# Patient Record
Sex: Female | Born: 1940 | Race: White | Hispanic: No | Marital: Married | Smoking: Never smoker
Health system: Southern US, Community
[De-identification: ages and names within clinical notes are randomized; demographics above are authoritative.]

## PROBLEM LIST (undated history)

## (undated) DIAGNOSIS — R319 Hematuria, unspecified: Secondary | ICD-10-CM

## (undated) DIAGNOSIS — E079 Disorder of thyroid, unspecified: Secondary | ICD-10-CM

## (undated) DIAGNOSIS — N200 Calculus of kidney: Secondary | ICD-10-CM

## (undated) DIAGNOSIS — T8859XA Other complications of anesthesia, initial encounter: Secondary | ICD-10-CM

## (undated) DIAGNOSIS — R519 Headache, unspecified: Secondary | ICD-10-CM

## (undated) DIAGNOSIS — M8580 Other specified disorders of bone density and structure, unspecified site: Secondary | ICD-10-CM

## (undated) DIAGNOSIS — M1990 Unspecified osteoarthritis, unspecified site: Secondary | ICD-10-CM

## (undated) DIAGNOSIS — D1801 Hemangioma of skin and subcutaneous tissue: Secondary | ICD-10-CM

## (undated) DIAGNOSIS — K759 Inflammatory liver disease, unspecified: Secondary | ICD-10-CM

## (undated) DIAGNOSIS — E785 Hyperlipidemia, unspecified: Secondary | ICD-10-CM

## (undated) DIAGNOSIS — K635 Polyp of colon: Secondary | ICD-10-CM

## (undated) DIAGNOSIS — T4145XA Adverse effect of unspecified anesthetic, initial encounter: Secondary | ICD-10-CM

## (undated) DIAGNOSIS — K219 Gastro-esophageal reflux disease without esophagitis: Secondary | ICD-10-CM

## (undated) HISTORY — DX: Inflammatory liver disease, unspecified: K75.9

## (undated) HISTORY — PX: TONSILLECTOMY: SUR1361

## (undated) HISTORY — DX: Polyp of colon: K63.5

## (undated) HISTORY — DX: Hematuria, unspecified: R31.9

## (undated) HISTORY — DX: Other specified disorders of bone density and structure, unspecified site: M85.80

## (undated) HISTORY — PX: ESOPHAGOGASTRODUODENOSCOPY ENDOSCOPY: SHX5814

## (undated) HISTORY — DX: Calculus of kidney: N20.0

## (undated) HISTORY — DX: Disorder of thyroid, unspecified: E07.9

## (undated) HISTORY — DX: Hemangioma of skin and subcutaneous tissue: D18.01

## (undated) HISTORY — DX: Hyperlipidemia, unspecified: E78.5

---

## 1974-12-08 DIAGNOSIS — K759 Inflammatory liver disease, unspecified: Secondary | ICD-10-CM

## 1974-12-08 HISTORY — DX: Inflammatory liver disease, unspecified: K75.9

## 1992-12-08 HISTORY — PX: THYROIDECTOMY, PARTIAL: SHX18

## 1994-12-08 HISTORY — PX: ABDOMINAL HYSTERECTOMY: SHX81

## 1998-05-30 ENCOUNTER — Other Ambulatory Visit: Admission: RE | Admit: 1998-05-30 | Discharge: 1998-05-30 | Payer: Self-pay | Admitting: *Deleted

## 1999-06-05 ENCOUNTER — Other Ambulatory Visit: Admission: RE | Admit: 1999-06-05 | Discharge: 1999-06-05 | Payer: Self-pay | Admitting: *Deleted

## 1999-12-12 ENCOUNTER — Encounter: Admission: RE | Admit: 1999-12-12 | Discharge: 1999-12-12 | Payer: Self-pay | Admitting: Endocrinology

## 1999-12-12 ENCOUNTER — Encounter: Payer: Self-pay | Admitting: Endocrinology

## 2000-06-04 ENCOUNTER — Other Ambulatory Visit: Admission: RE | Admit: 2000-06-04 | Discharge: 2000-06-04 | Payer: Self-pay | Admitting: *Deleted

## 2001-06-14 ENCOUNTER — Other Ambulatory Visit: Admission: RE | Admit: 2001-06-14 | Discharge: 2001-06-14 | Payer: Self-pay | Admitting: *Deleted

## 2002-06-20 ENCOUNTER — Other Ambulatory Visit: Admission: RE | Admit: 2002-06-20 | Discharge: 2002-06-20 | Payer: Self-pay | Admitting: *Deleted

## 2003-06-26 ENCOUNTER — Other Ambulatory Visit: Admission: RE | Admit: 2003-06-26 | Discharge: 2003-06-26 | Payer: Self-pay | Admitting: *Deleted

## 2003-09-26 ENCOUNTER — Encounter (INDEPENDENT_AMBULATORY_CARE_PROVIDER_SITE_OTHER): Payer: Self-pay

## 2003-09-26 ENCOUNTER — Ambulatory Visit (HOSPITAL_COMMUNITY): Admission: RE | Admit: 2003-09-26 | Discharge: 2003-09-26 | Payer: Self-pay | Admitting: Gastroenterology

## 2004-07-10 ENCOUNTER — Other Ambulatory Visit: Admission: RE | Admit: 2004-07-10 | Discharge: 2004-07-10 | Payer: Self-pay | Admitting: *Deleted

## 2005-08-14 ENCOUNTER — Other Ambulatory Visit: Admission: RE | Admit: 2005-08-14 | Discharge: 2005-08-14 | Payer: Self-pay | Admitting: *Deleted

## 2006-08-20 ENCOUNTER — Other Ambulatory Visit: Admission: RE | Admit: 2006-08-20 | Discharge: 2006-08-20 | Payer: Self-pay | Admitting: *Deleted

## 2006-08-28 ENCOUNTER — Ambulatory Visit (HOSPITAL_COMMUNITY): Admission: RE | Admit: 2006-08-28 | Discharge: 2006-08-28 | Payer: Self-pay | Admitting: Endocrinology

## 2006-09-18 ENCOUNTER — Encounter: Admission: RE | Admit: 2006-09-18 | Discharge: 2006-09-18 | Payer: Self-pay | Admitting: Gastroenterology

## 2007-07-27 ENCOUNTER — Other Ambulatory Visit: Admission: RE | Admit: 2007-07-27 | Discharge: 2007-07-27 | Payer: Self-pay | Admitting: *Deleted

## 2008-08-01 ENCOUNTER — Other Ambulatory Visit: Admission: RE | Admit: 2008-08-01 | Discharge: 2008-08-01 | Payer: Self-pay | Admitting: Obstetrics & Gynecology

## 2009-08-26 ENCOUNTER — Encounter: Admission: RE | Admit: 2009-08-26 | Discharge: 2009-08-26 | Payer: Self-pay | Admitting: Orthopedic Surgery

## 2010-12-28 ENCOUNTER — Encounter: Payer: Self-pay | Admitting: Gastroenterology

## 2011-04-25 NOTE — Op Note (Signed)
NAME:  Stephanie Davila, Stephanie Davila                        ACCOUNT NO.:  [id number]   MEDICAL RECORD NO.:  [id number]                   PATIENT TYPE:  AMB   LOCATION:  ENDO                                 FACILITY:  [facility]   PHYSICIAN:  Petra Kuba, M.D.                 DATE OF BIRTH:  1941-02-23   DATE OF PROCEDURE:  09/26/2003  DATE OF DISCHARGE:                                 OPERATIVE REPORT   PROCEDURE PERFORMED:  Colonoscopy with hot biopsy.   ENDOSCOPIST:  Petra Kuba, M.D.   INDICATIONS FOR PROCEDURE:  Patient due for colonic screening.  Consent was  signed after the risks, benefits, methods and options were thoroughly  discussed in the office.   MEDICINES USED:  Demerol 80 mg, Versed 8 mg.   DESCRIPTION OF PROCEDURE:  Rectal inspection was pertinent for external  hemorrhoids, small.  Digital exam was negative.  A video pediatric  adjustable colonoscope was inserted and easily advanced around the colon to  the cecum.  This did not require any abdominal pressure or any position  changes.  No obvious abnormality was seen on insertion.  The cecum was  identified by the appendiceal orifice and the ileocecal valve.  In fact, the  scope was inserted a short ways into the terminal ileum which was normal.  Photodocumentation was obtained.  The scope was slowly withdrawn.  The prep  was adequate.  There was minimal liquid stool that required washing and  suctioning.  On slow withdrawal through the colon the right side of the  colon was normal.  As we withdrew around the left side, there were a few  tiny probable hyperplastic appearing polyps which were hot biopsied, put  into safe container.  Once back in the rectum, anorectal pull-through and  retroflexion confirmed some hemorrhoids.  Scope was straightened and  readvanced a short ways up the left side of the colon, air was suctioned,  scope removed.  The patient tolerated the procedure well.  There was no  immediate obvious  complication.   ENDOSCOPIC DIAGNOSIS:  1. Internal and external hemorrhoids.  2. Few tiny left-sided probably hyperplastic-appearing polyps hot biopsied.  3. Otherwise within normal limits to the terminal ileum.    PLAN:  Await pathology to determine future colonic screening, probably  recheck in five years.  Happy to see back p.r.n.  Otherwise return care to  Dr. Evlyn Kanner for the customary health care maintenance to include yearly  rectals and guaiacs.                                                Petra Kuba, M.D.    MEM/MEDQ  D:  09/26/2003  T:  09/26/2003  Job:  [id number]   cc:   Jeannett Senior A. [place],  M.D.  [address]  [place]  [place] [address]  Fax: [phone]

## 2012-05-25 ENCOUNTER — Ambulatory Visit (INDEPENDENT_AMBULATORY_CARE_PROVIDER_SITE_OTHER): Payer: Medicare Other | Admitting: Cardiovascular Disease

## 2012-05-25 ENCOUNTER — Encounter: Payer: Self-pay | Admitting: Cardiovascular Disease

## 2012-05-25 VITALS — BP 137/82 | HR 64 | Ht 65.0 in | Wt 145.0 lb

## 2012-05-25 DIAGNOSIS — R0789 Other chest pain: Secondary | ICD-10-CM | POA: Insufficient documentation

## 2012-05-25 DIAGNOSIS — R079 Chest pain, unspecified: Secondary | ICD-10-CM

## 2012-05-25 NOTE — Progress Notes (Signed)
   History of Present Illness: 71 yo WF with history of HLD, thyroid disease here today for evaluation of chest pain. She has been intolerant of statins in the past. She tells me that she has recently lost her mother to dementia and has been under much stress. She has had much anxiety. In January 2013, she was stressed and felt her heart pounding. She felt that she was having a panic attack. There was no chest pain or SOB at that time. Over the last two months, she has been awakened several times with right shoulder pain/upper right chest pain with radiation into her right neck. This has happended three times. She has no exertional chest pains or SOB.    Primary Care Physician: Adrian Prince  Past Medical History  Diagnosis Date  . Thyroid disease   . Hyperlipidemia   . Osteopenia     dexa 2008  . Nephrolithiasis   . Colon polyps   . Hepatitis   . Hematuria     Past Surgical History  Procedure Date  . Thyroidectomy, partial   . Abdominal hysterectomy   . Tonsillectomy     Current Outpatient Prescriptions  Medication Sig Dispense Refill  . levothyroxine (SYNTHROID, LEVOTHROID) 112 MCG tablet Take 112 mcg by mouth daily.        Allergies  Allergen Reactions  . Penicillins   . Sulfa Antibiotics     History   Social History  . Marital Status: Married    Spouse Name: N/A    Number of Children: 1  . Years of Education: N/A   Occupational History  . Retired Programmer, systems    Social History Main Topics  . Smoking status: Never Smoker   . Smokeless tobacco: Not on file  . Alcohol Use: No  . Drug Use: No  . Sexually Active: Not on file   Other Topics Concern  . Not on file   Social History Narrative  . No narrative on file    Family History  Problem Relation Age of Onset  . Hip fracture Mother   . Dementia Mother   . Coronary artery disease Father 44    CABG  . Coronary artery disease Paternal Aunt     Review of Systems:  As stated in the HPI and otherwise  negative.   BP 137/82  Pulse 64  Ht 5\' 5"  (1.651 m)  Wt 145 lb (65.772 kg)  BMI 24.13 kg/m2  Physical Examination: General: Well developed, well nourished, NAD HEENT: OP clear, mucus membranes moist SKIN: warm, dry. No rashes. Neuro: No focal deficits Musculoskeletal: Muscle strength 5/5 all ext Psychiatric: Mood and affect normal Neck: No JVD, no carotid bruits, no thyromegaly, no lymphadenopathy. Lungs:Clear bilaterally, no wheezes, rhonci, crackles Cardiovascular: Regular rate and rhythm. No murmurs, gallops or rubs. Abdomen:Soft. Bowel sounds present. Non-tender.  Extremities: No lower extremity edema. Pulses are 2 + in the bilateral DP/PT.  EKG: NSR, rate 64 bpm.

## 2012-05-25 NOTE — Patient Instructions (Signed)
Your physician recommends that you schedule a follow-up appointment in: 4-5 weeks.   Your physician has requested that you have an echocardiogram. Echocardiography is a painless test that uses sound waves to create images of your heart. It provides your doctor with information about the size and shape of your heart and how well your heart's chambers and valves are working. This procedure takes approximately one hour. There are no restrictions for this procedure.   Your physician has requested that you have an exercise tolerance test. For further information please visit [email or web address] Please also follow instruction sheet, as given.

## 2012-05-25 NOTE — Assessment & Plan Note (Signed)
Atypical chest pains with right shoulder pain. Her risk factors for CAD include age, family history and hyperlipidemia. I will arrange an echo to exclude structural heart disease and an exercise treadmill stress test to exclude ischemia.

## 2012-05-27 ENCOUNTER — Ambulatory Visit (HOSPITAL_COMMUNITY): Payer: Medicare Other | Attending: Cardiology | Admitting: Radiology

## 2012-05-27 DIAGNOSIS — R079 Chest pain, unspecified: Secondary | ICD-10-CM

## 2012-05-27 DIAGNOSIS — M79609 Pain in unspecified limb: Secondary | ICD-10-CM | POA: Insufficient documentation

## 2012-05-27 DIAGNOSIS — E785 Hyperlipidemia, unspecified: Secondary | ICD-10-CM | POA: Insufficient documentation

## 2012-05-27 DIAGNOSIS — R072 Precordial pain: Secondary | ICD-10-CM | POA: Insufficient documentation

## 2012-05-27 NOTE — Progress Notes (Signed)
Echocardiogram performed.  

## 2012-06-02 ENCOUNTER — Ambulatory Visit (INDEPENDENT_AMBULATORY_CARE_PROVIDER_SITE_OTHER): Payer: Medicare Other | Admitting: Nurse Practitioner

## 2012-06-02 ENCOUNTER — Encounter: Payer: Self-pay | Admitting: Nurse Practitioner

## 2012-06-02 DIAGNOSIS — R079 Chest pain, unspecified: Secondary | ICD-10-CM

## 2012-06-02 NOTE — Procedures (Signed)
Exercise Treadmill Test  Pre-Exercise Testing Evaluation Rhythm: normal sinus  Rate: 67   PR:  .15 QRS:  .07  QT:  .40 QTc: .42     Test  Exercise Tolerance Test Ordering MD: Melene Muller, MD  Interpreting MD: Loletta Specter , NP  Unique Test No: 1  Treadmill:  1  Indication for ETT: chest pain - rule out ischemia  Contraindication to ETT: No   Stress Modality: exercise - treadmill  Cardiac Imaging Performed: non   Protocol: standard Bruce - maximal  Max BP:  178/74  Max MPHR (bpm):  150 85% MPR (bpm):  128  MPHR obtained (bpm):  150 % MPHR obtained:  100%  Reached 85% MPHR (min:sec):  4:00 Total Exercise Time (min-sec):  9:06  Workload in METS:  10.2 Borg Scale: 13  Reason ETT Terminated:  patient's desire to stop    ST Segment Analysis At Rest: normal ST segments - no evidence of significant ST depression With Exercise: no evidence of significant ST depression  Other Information Arrhythmia:  occasional pvc Angina during ETT:  absent (0) Quality of ETT:  diagnostic  ETT Interpretation:  normal - no evidence of ischemia by ST analysis  Comments: Excellent exercise tolerance.  No chest pain or ecg changes.  Recommendations: Follow-up with Dr. Clifton James prn.

## 2012-06-04 ENCOUNTER — Encounter: Payer: BC Managed Care – PPO | Admitting: Physician Assistant

## 2012-06-30 ENCOUNTER — Ambulatory Visit: Payer: BC Managed Care – PPO | Admitting: Cardiovascular Disease

## 2012-10-19 ENCOUNTER — Other Ambulatory Visit: Payer: Self-pay | Admitting: Gastroenterology

## 2013-09-13 DIAGNOSIS — C44310 Basal cell carcinoma of skin of unspecified parts of face: Secondary | ICD-10-CM | POA: Insufficient documentation

## 2013-09-15 HISTORY — PX: MOHS SURGERY: SUR867

## 2013-09-19 ENCOUNTER — Encounter: Payer: Self-pay | Admitting: Nurse Practitioner

## 2013-09-20 ENCOUNTER — Encounter: Payer: Self-pay | Admitting: Nurse Practitioner

## 2013-09-20 ENCOUNTER — Ambulatory Visit (INDEPENDENT_AMBULATORY_CARE_PROVIDER_SITE_OTHER): Payer: Medicare Other | Admitting: Nurse Practitioner

## 2013-09-20 VITALS — BP 138/86 | HR 64 | Resp 16 | Ht 65.0 in | Wt 148.0 lb

## 2013-09-20 DIAGNOSIS — E039 Hypothyroidism, unspecified: Secondary | ICD-10-CM

## 2013-09-20 DIAGNOSIS — C44301 Unspecified malignant neoplasm of skin of nose: Secondary | ICD-10-CM

## 2013-09-20 DIAGNOSIS — Z01419 Encounter for gynecological examination (general) (routine) without abnormal findings: Secondary | ICD-10-CM

## 2013-09-20 DIAGNOSIS — C44319 Basal cell carcinoma of skin of other parts of face: Secondary | ICD-10-CM

## 2013-09-20 NOTE — Patient Instructions (Signed)

## 2013-09-20 NOTE — Progress Notes (Signed)
72 y.o. G1. Married Caucasian Fe here for annual exam.  Tried vaginal Estrace cream several times since last here and had severe itching. Also got a bad UTI after use the next day.  She does better with extra virgin olive oil.  Recent Mohs' surgery on end of nose for basal cell cancer.  Mother passed away last Feb 26, 2023.   No LMP recorded.          Sexually active: no  The current method of family planning is status post hysterectomy.    Exercising: yes  Gym/ health club routine includes walking on track . Smoker:  no  Health Maintenance: Pap:  08/01/08 normal MMG:  10/21/12 normal Colonoscopy:  2010 1 polyp repeat in 5 years BMD:   3 yrs ago at PCP TDaP:  09/12/11 Labs: per Dr. Evlyn Kanner   reports that she has never smoked. She has never used smokeless tobacco. She reports that she does not drink alcohol or use illicit drugs.  Past Medical History  Diagnosis Date  . Thyroid disease   . Hyperlipidemia   . Osteopenia     dexa 2008  . Nephrolithiasis   . Colon polyps   . Hepatitis 1976  . Hematuria     negative uro eval X 2  . 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 Thyroidectomy, partial  1994  . Tonsillectomy    . Abdominal hysterectomy  1996    BSO secondary to AUB and fibroids  . Esophagogastroduodenoscopy endoscopy  summer 2013    Dr. Ewing Schlein  . Mohs surgery  09/15/13    nose    Current Outpatient Prescriptions  Medication Sig Dispense Refill  . Calcium Carbonate (CALCIUM 600 PO) Take by mouth.      . cholecalciferol (VITAMIN D) 1000 UNITS tablet Take 1,000 Units by mouth daily.      Marland Kitchen levothyroxine (SYNTHROID, LEVOTHROID) 112 MCG tablet Take 112 mcg by mouth daily.      . Multiple Vitamin (MULTIVITAMIN) tablet Take 1 tablet by mouth daily.      . temazepam (RESTORIL) 15 MG capsule as needed.       No current facility-administered medications for this visit.    Family History  Problem Relation Age of Onset   . Hip fracture Mother   . Dementia Mother   . Coronary artery disease Father 79    CABG  . Coronary artery disease Paternal Aunt     ROS:  Pertinent items are noted in HPI.  Otherwise, a comprehensive ROS was negative.  Exam:   BP 138/86  Pulse 64  Resp 16  Ht 5\' 5"  (1.651 m)  Wt 148 lb (67.132 kg)  BMI 24.63 kg/m2 Height: 5\' 5"  (165.1 cm)  Ht Readings from Last 3 Encounters:  09/20/13 5\' 5"  (1.651 m)  05/25/12 5\' 5"  (1.651 m)    General appearance: alert, cooperative and appears stated age Head: Normocephalic, without obvious abnormality, atraumatic Neck: no adenopathy, supple, symmetrical, trachea midline and thyroid normal to inspection and palpation Lungs: clear to auscultation bilaterally Breasts: normal appearance, no masses or tenderness Heart: regular rate and rhythm Abdomen: soft, non-tender; no masses,  no organomegaly Extremities: extremities normal, atraumatic, no cyanosis or edema Skin: Skin color, texture, turgor normal. No rashes or lesions band aid on the end of nose. Lymph nodes: Cervical, supraclavicular, and axillary nodes normal. No abnormal inguinal nodes palpated Neurologic: Grossly normal   Pelvic: External genitalia:  no lesions              Urethra:  normal appearing urethra with no masses, tenderness or lesions              Bartholin's and Skene's: normal                 Vagina: normal appearing vagina with normal color and discharge, no lesions              Cervix: absent              Pap taken: no Bimanual Exam:  Uterus:  uterus absent              Adnexa: no mass, fullness, tenderness               Rectovaginal: Confirms               Anus:  normal sphincter tone, no lesions  A:  Well Woman with normal exam  S/P TVH/ BSO secondary to AUB and fibroids - off ERT 2007  Atrophic vaginitis OTC treatment only  Recent Mohs' surgery for basal cell cancer on the nose  Hypothyroid    P:   Pap smear as per guidelines Not done today  Mammogram  due 11/14  Counseled on breast self exam, adequate intake of calcium and vitamin D, diet and exercise, Kegel's exercises return annually or prn  An After Visit Summary was printed and given to the patient.

## 2013-09-22 NOTE — Progress Notes (Signed)
Encounter reviewed by Dr. Brook Silva.  

## 2013-12-07 ENCOUNTER — Ambulatory Visit: Payer: Self-pay | Admitting: Otolaryngology

## 2014-02-15 DIAGNOSIS — R1310 Dysphagia, unspecified: Secondary | ICD-10-CM | POA: Insufficient documentation

## 2014-02-15 DIAGNOSIS — R1110 Vomiting, unspecified: Secondary | ICD-10-CM

## 2014-02-15 DIAGNOSIS — IMO0001 Reserved for inherently not codable concepts without codable children: Secondary | ICD-10-CM | POA: Insufficient documentation

## 2014-02-17 ENCOUNTER — Other Ambulatory Visit: Payer: Self-pay | Admitting: Gastroenterology

## 2014-02-17 DIAGNOSIS — R079 Chest pain, unspecified: Secondary | ICD-10-CM

## 2014-02-17 DIAGNOSIS — R634 Abnormal weight loss: Secondary | ICD-10-CM

## 2014-02-17 DIAGNOSIS — R1010 Upper abdominal pain, unspecified: Secondary | ICD-10-CM

## 2014-02-23 ENCOUNTER — Ambulatory Visit
Admission: RE | Admit: 2014-02-23 | Discharge: 2014-02-23 | Disposition: A | Discharge status: Home / Self Care | Payer: Medicare Other | Source: Ambulatory Visit | Attending: Gastroenterology | Admitting: Gastroenterology

## 2014-02-23 DIAGNOSIS — R634 Abnormal weight loss: Secondary | ICD-10-CM

## 2014-02-23 DIAGNOSIS — R1010 Upper abdominal pain, unspecified: Secondary | ICD-10-CM

## 2014-02-23 DIAGNOSIS — R079 Chest pain, unspecified: Secondary | ICD-10-CM

## 2014-02-23 MED ORDER — IOHEXOL 300 MG/ML  SOLN
100.0000 mL | Freq: Once | INTRAMUSCULAR | Status: AC | PRN
  Administered 2014-02-23: 100 mL via INTRAVENOUS

## 2014-03-27 ENCOUNTER — Ambulatory Visit (HOSPITAL_COMMUNITY)
Admission: RE | Admit: 2014-03-27 | Discharge: 2014-03-27 | Disposition: A | Discharge status: Home / Self Care | Payer: Medicare Other | Source: Ambulatory Visit | Attending: Gastroenterology | Admitting: Gastroenterology

## 2014-03-27 ENCOUNTER — Encounter (HOSPITAL_COMMUNITY)
Admission: RE | Disposition: A | Discharge status: Home / Self Care | Payer: Self-pay | Source: Ambulatory Visit | Attending: Gastroenterology

## 2014-03-27 DIAGNOSIS — R1310 Dysphagia, unspecified: Secondary | ICD-10-CM | POA: Insufficient documentation

## 2014-03-27 DIAGNOSIS — K220 Achalasia of cardia: Secondary | ICD-10-CM | POA: Insufficient documentation

## 2014-03-27 DIAGNOSIS — R1110 Vomiting, unspecified: Secondary | ICD-10-CM | POA: Insufficient documentation

## 2014-03-27 HISTORY — PX: ESOPHAGEAL MANOMETRY: SHX5429

## 2014-03-27 SURGERY — MANOMETRY, ESOPHAGUS

## 2014-03-27 MED ORDER — LIDOCAINE VISCOUS 2 % MT SOLN
OROMUCOSAL | Status: AC
  Filled 2014-03-27: qty 15

## 2014-03-27 SURGICAL SUPPLY — 1 items: FACESHIELD LNG OPTICON STERILE (SAFETY) IMPLANT

## 2014-03-28 ENCOUNTER — Encounter (HOSPITAL_COMMUNITY): Payer: Self-pay | Admitting: Gastroenterology

## 2014-03-29 ENCOUNTER — Other Ambulatory Visit: Payer: Self-pay | Admitting: Gastroenterology

## 2014-03-30 NOTE — Addendum Note (Signed)
Addended by: Kaimani Clayson on: 03/30/2014 08:15 AM   Modules accepted: Orders  

## 2014-03-31 ENCOUNTER — Encounter (HOSPITAL_COMMUNITY): Payer: Self-pay | Admitting: *Deleted

## 2014-03-31 ENCOUNTER — Ambulatory Visit (HOSPITAL_COMMUNITY)
Admission: RE | Admit: 2014-03-31 | Discharge: 2014-03-31 | Disposition: A | Discharge status: Home / Self Care | Payer: Medicare Other | Source: Ambulatory Visit | Attending: Gastroenterology | Admitting: Gastroenterology

## 2014-03-31 ENCOUNTER — Encounter (HOSPITAL_COMMUNITY)
Admission: RE | Disposition: A | Discharge status: Home / Self Care | Payer: Self-pay | Source: Ambulatory Visit | Attending: Gastroenterology

## 2014-03-31 DIAGNOSIS — K220 Achalasia of cardia: Secondary | ICD-10-CM | POA: Insufficient documentation

## 2014-03-31 HISTORY — PX: ESOPHAGOGASTRODUODENOSCOPY: SHX5428

## 2014-03-31 HISTORY — DX: Other complications of anesthesia, initial encounter: T88.59XA

## 2014-03-31 HISTORY — DX: Adverse effect of unspecified anesthetic, initial encounter: T41.45XA

## 2014-03-31 HISTORY — PX: BOTOX INJECTION: SHX5754

## 2014-03-31 SURGERY — EGD (ESOPHAGOGASTRODUODENOSCOPY)
Anesthesia: Moderate Sedation

## 2014-03-31 MED ORDER — FENTANYL CITRATE 0.05 MG/ML IJ SOLN
INTRAMUSCULAR | Status: DC | PRN
  Administered 2014-03-31 (×2): 25 ug via INTRAVENOUS
  Administered 2014-03-31: 12.5 ug via INTRAVENOUS

## 2014-03-31 MED ORDER — ONDANSETRON HCL 4 MG/2ML IJ SOLN
INTRAMUSCULAR | Status: AC
  Filled 2014-03-31: qty 2

## 2014-03-31 MED ORDER — FENTANYL CITRATE 0.05 MG/ML IJ SOLN
INTRAMUSCULAR | Status: AC
  Filled 2014-03-31: qty 2

## 2014-03-31 MED ORDER — ONDANSETRON HCL 4 MG/2ML IJ SOLN
INTRAMUSCULAR | Status: DC | PRN
  Administered 2014-03-31: 4 mg via INTRAVENOUS

## 2014-03-31 MED ORDER — ONABOTULINUMTOXINA 100 UNITS IJ SOLR
100.0000 [IU] | Freq: Once | INTRAMUSCULAR | Status: AC
Start: 2014-03-31 — End: 2014-03-31
  Administered 2014-03-31: 100 [IU] via INTRAMUSCULAR
  Filled 2014-03-31: qty 100

## 2014-03-31 MED ORDER — MIDAZOLAM HCL 10 MG/2ML IJ SOLN
INTRAMUSCULAR | Status: AC
  Filled 2014-03-31: qty 4

## 2014-03-31 MED ORDER — SODIUM CHLORIDE 0.9 % IV SOLN
INTRAVENOUS | Status: DC
Start: 2014-03-31 — End: 2014-03-31
  Administered 2014-03-31: 500 mL via INTRAVENOUS

## 2014-03-31 MED ORDER — MIDAZOLAM HCL 10 MG/2ML IJ SOLN
INTRAMUSCULAR | Status: DC | PRN
  Administered 2014-03-31 (×3): 2 mg via INTRAVENOUS
  Administered 2014-03-31: 1 mg via INTRAVENOUS

## 2014-03-31 MED ORDER — SODIUM CHLORIDE 0.9 % IJ SOLN
INTRAMUSCULAR | Status: AC
  Filled 2014-03-31: qty 10

## 2014-03-31 NOTE — Progress Notes (Signed)
Stephanie Davila 8:21 AM  Subjective: Patient doing well without any new complaints since we saw her in the office recently  Objective: Vital signs stable afebrile no acute distress exam please see pre-assessment evaluation  Assessment: Achalasia  Plan: Okay to proceed with moderate sedation and Botox  Jeryl Columbia

## 2014-03-31 NOTE — Op Note (Signed)
[facility] [place], [address]   ENDOSCOPY PROCEDURE REPORT  PATIENT: Stephanie Davila, Stephanie Davila  MR#: [id number] BIRTHDATE: January 09, 1941 , 72  yrs. old GENDER: Female  ENDOSCOPIST: Clarene Essex, MD REFERRED BY:  PROCEDURE DATE:  03/31/2014 PROCEDURE:   EGD w/ directed submucosal injection(s), any substance with Botox ASA CLASS:   Class II INDICATIONS:Dysphagia.  history of achalasia  MEDICATIONS: Fentanyl 62.5 mcg IV, Versed 7 mg IV, and Zofran 4 mg IV  TOPICAL ANESTHETIC:used  DESCRIPTION OF PROCEDURE:   After the risks benefits and alternatives of the procedure were thoroughly explained, informed consent was obtained.  The Pentax Gastroscope [id number]  endoscope was introduced through the mouth and advanced to the second portion of the duodenum , limited by Without limitations. the esophagus was filled with foamy liquid which was easily suctioned and the findings are recorded below  The instrument was slowly withdrawn as the mucosa was fully examined.at the end of the endoscopy we injected Botox into the GE junction in the customary fashion using 1 cc x4 injections and the patient tolerated the procedure well there was no obvious immediate complication         FINDINGS:1. Obvious achalasia status post Botox injection as above 2. Otherwise within normal limits EGD  COMPLICATIONS:none  ENDOSCOPIC IMPRESSION: above   RECOMMENDATIONS:see how the Botox works call me when necessary otherwise followup in 4-6 weeks   REPEAT EXAM: as needed   _______________________________ Clarene Essex, MD eSigned:  Clarene Essex, MD 03/31/2014 8:54 AM    CC:

## 2014-03-31 NOTE — Discharge Instructions (Addendum)
Call if question or problem or if no better in one week otherwise followup in 4-6 weeksGastrointestinal Endoscopy Care After Refer to this sheet in the next few weeks. These instructions provide you with information on caring for yourself after your procedure. Your caregiver may also give you more specific instructions. Your treatment has been planned according to current medical practices, but problems sometimes occur. Call your caregiver if you have any problems or questions after your procedure. HOME CARE INSTRUCTIONS  If you were given medicine to help you relax (sedative), do not drive, operate machinery, or sign important documents for 24 hours.  Avoid alcohol and hot or warm beverages for the first 24 hours after the procedure.  Only take over-the-counter or prescription medicines for pain, discomfort, or fever as directed by your caregiver. You may resume taking your normal medicines unless your caregiver tells you otherwise. Ask your caregiver when you may resume taking medicines that may cause bleeding, such as aspirin, clopidogrel, or warfarin.  You may return to your normal diet and activities on the day after your procedure, or as directed by your caregiver. Walking may help to reduce any bloated feeling in your abdomen.  Drink enough fluids to keep your urine clear or pale yellow.  You may gargle with salt water if you have a sore throat. SEEK IMMEDIATE MEDICAL CARE IF:  You have severe nausea or vomiting.  You have severe abdominal pain, abdominal cramps that last longer than 6 hours, or abdominal swelling (distention).  You have severe shoulder or back pain.  You have trouble swallowing.  You have shortness of breath, your breathing is shallow, or you are breathing faster than normal.  You have a fever or a rapid heartbeat.  You vomit blood or material that looks like coffee grounds.  You have bloody, black, or tarry stools. MAKE SURE YOU:  Understand these  instructions.  Will watch your condition.  Will get help right away if you are not doing well or get worse. Document Released: 07/08/2004 Document Revised: 05/25/2012 Document Reviewed: 02/24/2012 [facility] Patient Information 2014 [place], [place].

## 2014-04-03 ENCOUNTER — Encounter (HOSPITAL_COMMUNITY): Payer: Self-pay | Admitting: Gastroenterology

## 2014-05-12 ENCOUNTER — Ambulatory Visit: Payer: Medicare Other | Admitting: Podiatry

## 2014-09-07 DIAGNOSIS — K220 Achalasia of cardia: Secondary | ICD-10-CM | POA: Insufficient documentation

## 2014-09-07 HISTORY — PX: HELLER MYOTOMY: SHX5259

## 2014-09-22 ENCOUNTER — Ambulatory Visit: Payer: Medicare Other | Admitting: Nurse Practitioner

## 2014-10-09 ENCOUNTER — Encounter (HOSPITAL_COMMUNITY): Payer: Self-pay | Admitting: Gastroenterology

## 2015-04-02 ENCOUNTER — Telehealth: Payer: Self-pay | Admitting: Nurse Practitioner

## 2015-04-02 NOTE — Telephone Encounter (Signed)
Patient found a vaginal lump and would like an appointment. Last seen 09/20/13.

## 2015-04-02 NOTE — Telephone Encounter (Signed)
Spoke with patient. Patient states that she found a "lump" between her vagina in rectum in the bend of the leg. Denies any pain with area, redness, or warmth. "I am not sure what it is but I can see a raised area in the mirror." Patient is going out of town Thursday and requests to see Milford Cage, FNP before then. Has an appointment scheduled for tomorrow afternoon at 2pm with another provider. Declines appointment for today at 3:30pm with Milford Cage, [place]. Declines to see another provider on Wednesday. Declines to come in tomorrow for 12:45pm appointment. Appointment scheduled for tomorrow at 4:15pm with Milford Cage, [place]. Patient is agreeable to date and time.  Routing to provider for final review. Patient agreeable to disposition. Will close encounter

## 2015-04-03 ENCOUNTER — Encounter: Payer: Self-pay | Admitting: Nurse Practitioner

## 2015-04-03 ENCOUNTER — Ambulatory Visit (INDEPENDENT_AMBULATORY_CARE_PROVIDER_SITE_OTHER): Payer: Medicare PPO | Admitting: Nurse Practitioner

## 2015-04-03 VITALS — BP 120/70 | HR 72 | Resp 16 | Ht 65.0 in | Wt 143.0 lb

## 2015-04-03 DIAGNOSIS — R229 Localized swelling, mass and lump, unspecified: Secondary | ICD-10-CM

## 2015-04-03 NOTE — Progress Notes (Signed)
Subjective:     Patient ID: Stephanie Davila, female   DOB: 1941-03-07, 74 y.o.   MRN: [id number]  HPI  This 74 yo WM Fe presents with a concern about a knot that she felt while bathing at the left perianal area on Sunday night.  She has had no pain, swelling, and warmth of this area.  She denies trauma to this area.  She has no discharge or exudate.    She had surgery done at [facility] 09/07/2014 for laparotomy, esophagotomy with fundoplasty - Heller type for Achalasia.  She has done well with that and recovery has been good.  Some constipation issues at times but no other bowel changes.  n sign of blood or mucous via rectum.  Review of Systems  Constitutional: Negative for chills, diaphoresis, appetite change and fatigue.  HENT: Negative.   Respiratory: Negative.   Cardiovascular: Negative.   Gastrointestinal: Positive for constipation. Negative for nausea, vomiting, abdominal pain, diarrhea, blood in stool, anal bleeding and rectal pain.  Genitourinary: Negative.   Musculoskeletal: Negative.   Skin: Negative.   Neurological: Negative.   Psychiatric/Behavioral: Negative.        Objective:   Physical Exam  Constitutional: She is oriented to person, place, and time. She appears well-developed and well-nourished.  Pulmonary/Chest: Effort normal.  Abdominal: Soft. She exhibits no distension. There is no tenderness. There is no rebound and no guarding.  Genitourinary:  Normal vulvar tissues without discharge.  There is a firm mobile 1 cm size mass that is non tender on left side of perianal area.  No pain or mass is felt within the vagina.  Dr. Sabra Heck was asked to look at this area as well.  This could represent a  Lipoma or sebaceous cyst.  Neurological: She is alert and oriented to person, place, and time.  Psychiatric: She has a normal mood and affect. Her behavior is normal. Judgment and thought content normal.       Assessment:     Mass left peri anal area - ? Lipoma or sebaceous  cyst    Plan:     Will plan on her AEX in 3 months and recheck this area at same time If this area becomes sore swollen or exudate to call back If area becomes larger to call back

## 2015-04-03 NOTE — Patient Instructions (Signed)
Annual exam in 3 months and will recheck area

## 2015-04-03 NOTE — Progress Notes (Signed)
Pt seen with NP, Kem Boroughs.  Feel this is most likely a lipoma.  Reviewed with pt imaging tests that could be diagnostic.  Feel this is not needed and pt is in agreement.  Agree with recheck for follow up.  Lesion measured today with Kem Boroughs and is about 1cm.  Reviewed personally.  Felipa Emory, MD.

## 2015-07-11 ENCOUNTER — Encounter: Payer: Self-pay | Admitting: Nurse Practitioner

## 2015-07-11 ENCOUNTER — Ambulatory Visit (INDEPENDENT_AMBULATORY_CARE_PROVIDER_SITE_OTHER): Payer: Medicare PPO | Admitting: Nurse Practitioner

## 2015-07-11 VITALS — BP 126/74 | HR 72 | Ht 65.0 in | Wt 145.0 lb

## 2015-07-11 DIAGNOSIS — Z01419 Encounter for gynecological examination (general) (routine) without abnormal findings: Secondary | ICD-10-CM | POA: Diagnosis not present

## 2015-07-11 NOTE — Patient Instructions (Signed)

## 2015-07-11 NOTE — Progress Notes (Signed)
Patient ID: Stephanie Davila, female   DOB: 03/16/1941, 74 y.o.   MRN: [id number] 74 y.o. [id number] Married  Caucasian Fe here for annual exam.  Oral surgery last Wednesday and will be having dental implant after bone grafting. She had been seen in April for a mass along the left perianal area.  She was also seen by Dr. Sabra Heck at that time.  Decided on watchful waiting to see if area would improve on its own.  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 At this time she feels area is much smaller and at times feels no mass or cystic lesion.  No LMP recorded. Patient is postmenopausal.          Sexually active: Yes.    The current method of family planning is post menopausal status.    Exercising: Yes.    walking and yoga at least 3 times per week Smoker:  no  Health Maintenance: Pap: 08/01/08, Negative  MMG: 10/21/12, BI-RADS 2: benign findings Colonoscopy: 2010, tubular adenoma, recall 5 years BMD: recent at Dr. Baldwin Crown office - osteopenia TDaP: 09/12/11 Labs: PCP   reports that she has never smoked. She has never used smokeless tobacco. She reports that she drinks alcohol. She reports that she does not use illicit drugs.  Past Medical History  Diagnosis Date  . Thyroid disease   . Hyperlipidemia   . Osteopenia     dexa 2008  . Nephrolithiasis   . Colon polyps   . Hepatitis 1976  . Hematuria     negative uro eval X 2  . 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 Complication of anesthesia     ALWAYS NAUESA & VOMITING    Past Surgical History  Procedure Laterality Date  . Thyroidectomy, partial  1994  . Tonsillectomy    . Abdominal hysterectomy  1996    BSO secondary to AUB and fibroids  . Esophagogastroduodenoscopy endoscopy  summer 2013    Dr. Watt Climes  . Mohs surgery  09/15/13    nose  . Esophageal manometry N/A 03/27/2014    Procedure: ESOPHAGEAL MANOMETRY (EM);  Surgeon: Jeryl Columbia, MD;  Location: [facility];  Service: Endoscopy;  Laterality: N/A;  . Esophagogastroduodenoscopy N/A 03/31/2014    Procedure: ESOPHAGOGASTRODUODENOSCOPY (EGD);  Surgeon: Jeryl Columbia, MD;  Location: [facility];  Service: Endoscopy;  Laterality: N/A;  . Botox injection N/A 03/31/2014    Procedure: BOTOX INJECTION;  Surgeon: Jeryl Columbia, MD;  Location: [facility];  Service: Endoscopy;  Laterality: N/A;  Bubba Hales myotomy  09/07/14    Current Outpatient Prescriptions  Medication Sig Dispense Refill  . Calcium Carbonate (CALCIUM 600 PO) Take by mouth.    . cholecalciferol (VITAMIN D) 1000 UNITS tablet Take 1,000 Units by mouth daily.    Marland Kitchen levothyroxine (SYNTHROID, LEVOTHROID) 112 MCG tablet Take 112 mcg by mouth daily.    . Multiple Vitamin (MULTIVITAMIN) tablet Take 1 tablet by mouth daily.    . temazepam (RESTORIL) 15 MG capsule as needed.     No current facility-administered medications for this visit.    Family History  Problem Relation Age of Onset  . Hip fracture Mother   . Dementia Mother   . Coronary artery disease Father 73    CABG  . Coronary artery disease Paternal Aunt     ROS:  Pertinent items are noted in HPI.  Otherwise, a comprehensive ROS  was negative.  Exam:   BP 126/74 mmHg  Pulse 72  Ht 5\' 5"  (1.651 m)  Wt 145 lb (65.772 kg)  BMI 24.13 kg/m2 Height: 5\' 5"  (165.1 cm) Ht Readings from Last 3 Encounters:  07/11/15 5\' 5"  (1.651 m)  04/03/15 5\' 5"  (1.651 m)  03/27/14 5\' 5"  (1.651 m)    General appearance: alert, cooperative and appears stated age Head: Normocephalic, without obvious abnormality, atraumatic Neck: no adenopathy, supple, symmetrical, trachea midline and thyroid normal to inspection and palpation Lungs: clear to auscultation bilaterally Breasts: normal appearance, no masses or tenderness Heart: regular rate and rhythm Abdomen: soft, non-tender; no masses,  no organomegaly Extremities: extremities normal, atraumatic, no cyanosis or  edema Skin: Skin color, texture, turgor normal. No rashes or lesions Lymph nodes: Cervical, supraclavicular, and axillary nodes normal. No abnormal inguinal nodes palpated Neurologic: Grossly normal   Pelvic: External genitalia:  no lesions, no area of swelling cyst or lipoma at previous perianal area on the left.              Urethra:  normal appearing urethra with no masses, tenderness or lesions              Bartholin's and Skene's: normal                 Vagina: normal appearing vagina with normal color and discharge, no lesions              Cervix: absent              Pap taken: No. 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

## 2015-07-14 NOTE — Progress Notes (Signed)
Encounter reviewed by Dr. Aundria Rud. Will get copies of recent mammograms.

## 2017-01-27 ENCOUNTER — Ambulatory Visit (INDEPENDENT_AMBULATORY_CARE_PROVIDER_SITE_OTHER): Payer: Medicare Other | Admitting: Obstetrics and Gynecology

## 2017-01-27 ENCOUNTER — Encounter: Payer: Self-pay | Admitting: Obstetrics and Gynecology

## 2017-01-27 ENCOUNTER — Telehealth: Payer: Self-pay | Admitting: Nurse Practitioner

## 2017-01-27 VITALS — BP 128/70 | HR 84 | Resp 14 | Wt 144.0 lb

## 2017-01-27 DIAGNOSIS — M8580 Other specified disorders of bone density and structure, unspecified site: Secondary | ICD-10-CM | POA: Diagnosis not present

## 2017-01-27 DIAGNOSIS — L0231 Cutaneous abscess of buttock: Secondary | ICD-10-CM

## 2017-01-27 NOTE — Telephone Encounter (Signed)
Patient states she found a lump on left side of her vagina.  States it is red and looks like it has pus pocket.

## 2017-01-27 NOTE — Telephone Encounter (Signed)
Spoke with patient. Patient states she has an area on left side, "in vaginal area" that is tender and possibly a "puss" pocket. Patient states she noticed this 2/18 while in shower. Patient denies any discharge from area, has cleaned area with witch hazel. Patient states she believes she may have experienced the same thing many years ago, but this time the area she believes is larger. Recommended OV for further evaluation with physician, patient scheduled for 01/27/17 at 1pm with Dr. Talbert Nan. Patient is agreeable to date and time.  Routing to provider for final review. Patient is agreeable to disposition. Will close encounter.  Cc: Kem Boroughs, NP

## 2017-01-27 NOTE — Patient Instructions (Signed)
Skin Abscess A skin abscess is an infected area on or under your skin that contains a collection of pus and other material. An abscess may also be called a furuncle, carbuncle, or boil. An abscess can occur in or on almost any part of your body. Some abscesses break open (rupture) on their own. Most continue to get worse unless they are treated. The infection can spread deeper into the body and eventually into your blood, which can make you feel ill. Treatment usually involves draining the abscess. What are the causes? An abscess occurs when germs, often bacteria, pass through your skin and cause an infection. This may be caused by:  A scrape or cut on your skin.  A puncture wound through your skin, including a needle injection.  Blocked oil or sweat glands.  Blocked and infected hair follicles.  A cyst that forms beneath your skin (sebaceous cyst) and becomes infected. What increases the risk? This condition is more likely to develop in people who:  Have a weak body defense system (immune system).  Have diabetes.  Have dry and irritated skin.  Get frequent injections or use illegal IV drugs.  Have a foreign body in a wound, such as a splinter.  Have problems with their lymph system or veins. What are the signs or symptoms? An abscess may start as a painful, firm bump under the skin. Over time, the abscess may get larger or become softer. Pus may appear at the top of the abscess, causing pressure and pain. It may eventually break through the skin and drain. Other symptoms include:  Redness.  Warmth.  Swelling.  Tenderness.  A sore on the skin. How is this diagnosed? This condition is diagnosed based on your medical history and a physical exam. A sample of pus may be taken from the abscess to find out what is causing the infection and what antibiotics can be used to treat it. You also may have:  Blood tests to look for signs of infection or spread of an infection to your  blood.  Imaging studies such as ultrasound, CT scan, or MRI if the abscess is deep. How is this treated? Small abscesses that drain on their own may not need treatment. Treatment for an abscess that does not rupture on its own may include:  Warm compresses applied to the area several times per day.  Incision and drainage. Your health care provider will make an incision to open the abscess and will remove pus and any foreign body or dead tissue. The incision area may be packed with gauze to keep it open for a few days while it heals.  Antibiotic medicines to treat infection. For a severe abscess, you may first get antibiotics through an IV and then change to oral antibiotics. Follow these instructions at home: Abscess Care  If you have an abscess that has not drained, place a warm, clean, wet washcloth over the abscess several times a day. Do this as told by your health care provider.  Follow instructions from your health care provider about how to take care of your abscess. Make sure you:  Cover the abscess with a bandage (dressing).  Change your dressing or gauze as told by your health care provider.  Wash your hands with soap and water before you change the dressing or gauze. If soap and water are not available, use hand sanitizer.  Check your abscess every day for signs of a worsening infection. Check for:  More redness, swelling, or pain.  More fluid or blood.  Warmth.  More pus or a bad smell. Medicines  Take over-the-counter and prescription medicines only as told by your health care provider.  If you were prescribed an antibiotic medicine, take it as told by your health care provider. Do not stop taking the antibiotic even if you start to feel better. General instructions  To avoid spreading the infection:  Do not share personal care items, towels, or hot tubs with others.  Avoid making skin contact with other people.  Keep all follow-up visits as told by your  health care provider. This is important. Contact a health care provider if:  You have more redness, swelling, or pain around your abscess.  You have more fluid or blood coming from your abscess.  Your abscess feels warm to the touch.  You have more pus or a bad smell coming from your abscess.  You have a fever.  You have muscle aches.  You have chills or a general ill feeling. Get help right away if:  You have severe pain.  You see red streaks on your skin spreading away from the abscess. This information is not intended to replace advice given to you by your health care provider. Make sure you discuss any questions you have with your health care provider. Document Released: 09/03/2005 Document Revised: 07/20/2016 Document Reviewed: 10/03/2015 Elsevier Interactive Patient Education  2017 [place]. Osteoporosis Osteoporosis is the thinning and loss of density in the bones. Osteoporosis makes the bones more brittle, fragile, and likely to break (fracture). Over time, osteoporosis can cause the bones to become so weak that they fracture after a simple fall. The bones most likely to fracture are the bones in the hip, wrist, and spine. What are the causes? The exact cause is not known. What increases the risk? Anyone can develop osteoporosis. You may be at greater risk if you have a family history of the condition or have poor nutrition. You may also have a higher risk if you are:  Female.  54 years old or older.  A smoker.  Not physically active.  White or Asian.  Slender. What are the signs or symptoms? A fracture might be the first sign of the disease, especially if it results from a fall or injury that would not usually cause a bone to break. Other signs and symptoms include:  Low back and neck pain.  Stooped posture.  Height loss. How is this diagnosed? To make a diagnosis, your health care provider may:  Take a medical history.  Perform a physical  exam.  Order tests, such as:  A bone mineral density test.  A dual-energy X-ray absorptiometry test. How is this treated? The goal of osteoporosis treatment is to strengthen your bones to reduce your risk of a fracture. Treatment may involve:  Making lifestyle changes, such as:  Eating a diet rich in calcium.  Doing weight-bearing and muscle-strengthening exercises.  Stopping tobacco use.  Limiting alcohol intake.  Taking medicine to slow the process of bone loss or to increase bone density.  Monitoring your levels of calcium and vitamin D. Follow these instructions at home:  Include calcium and vitamin D in your diet. Calcium is important for bone health, and vitamin D helps the body absorb calcium.  Perform weight-bearing and muscle-strengthening exercises as directed by your health care provider.  Do not use any tobacco products, including cigarettes, chewing tobacco, and electronic cigarettes. If you need help quitting, ask your health care provider.  Limit your  alcohol intake.  Take medicines only as directed by your health care provider.  Keep all follow-up visits as directed by your health care provider. This is important.  Take precautions at home to lower your risk of falling, such as:  Keeping rooms well lit and clutter free.  Installing safety rails on stairs.  Using rubber mats in the bathroom and other areas that are often wet or slippery. Get help right away if: You fall or injure yourself. This information is not intended to replace advice given to you by your health care provider. Make sure you discuss any questions you have with your health care provider. Document Released: 09/03/2005 Document Revised: 04/28/2016 Document Reviewed: 05/04/2014 Elsevier Interactive Patient Education  2017 [employer].

## 2017-01-27 NOTE — Progress Notes (Signed)
GYNECOLOGY  VISIT   HPI: 76 y.o.   Married  Caucasian  female   G1P1001 with No LMP recorded. Patient is postmenopausal.   here c/o vaginal nodule. She noticed a note in the vaginal area on Sunday while in the shower. It is tender. She looked with a mirror and thought she saw a "pus bump". She used some witch hazel on it. Yesterday it was bothering her, rubbing on her cloths. Not draining anything.  She was told by her primary that her DEXA was abnormal. Frax calculation of 12% of a femur fracture, and 21-22% of any fracture  GYNECOLOGIC HISTORY: No LMP recorded. Patient is postmenopausal. Contraception:postmenopause  Menopausal hormone therapy: none         OB History    Gravida Para Term Preterm AB Living   1 1 1     1    SAB TAB Ectopic Multiple Live Births                     Patient Active Problem List   Diagnosis Date Noted  . Unspecified hypothyroidism 09/20/2013  . Chest pain radiating to arm 05/25/2012    Past Medical History:  Diagnosis Date  . Colon polyps   . Complication of anesthesia    ALWAYS NAUESA & VOMITING  . 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 Hematuria    negative uro eval X 2  . Hepatitis 1976  . Hyperlipidemia   . Nephrolithiasis   . Osteopenia    dexa 2008  . Thyroid disease     Past Surgical History:  Procedure Laterality Date  . ABDOMINAL HYSTERECTOMY  1996   BSO secondary to AUB and fibroids  . BOTOX INJECTION N/A 03/31/2014   Procedure: BOTOX INJECTION;  Surgeon: Jeryl Columbia, MD;  Location: [facility];  Service: Endoscopy;  Laterality: N/A;  . ESOPHAGEAL MANOMETRY N/A 03/27/2014   Procedure: ESOPHAGEAL MANOMETRY (EM);  Surgeon: Jeryl Columbia, MD;  Location: [facility];  Service: Endoscopy;  Laterality: N/A;  . ESOPHAGOGASTRODUODENOSCOPY N/A 03/31/2014   Procedure: ESOPHAGOGASTRODUODENOSCOPY (EGD);  Surgeon: Jeryl Columbia, MD;  Location: [facility];  Service: Endoscopy;  Laterality: N/A;  . ESOPHAGOGASTRODUODENOSCOPY  ENDOSCOPY  summer 2013   Dr. Watt Climes  . HELLER MYOTOMY  09/07/14  . MOHS SURGERY  09/15/13   nose  . THYROIDECTOMY, PARTIAL  1994  . TONSILLECTOMY      Current Outpatient Prescriptions  Medication Sig Dispense Refill  . Calcium Carbonate (CALCIUM 600 PO) Take by mouth.    . cholecalciferol (VITAMIN D) 1000 UNITS tablet Take 1,000 Units by mouth daily.    Marland Kitchen levothyroxine (SYNTHROID, LEVOTHROID) 112 MCG tablet Take 112 mcg by mouth daily.    . Multiple Vitamin (MULTIVITAMIN) tablet Take 1 tablet by mouth daily.    . temazepam (RESTORIL) 15 MG capsule as needed.     No current facility-administered medications for this visit.      ALLERGIES: Codeine; Penicillins; and Sulfa antibiotics  Family History  Problem Relation Age of Onset  . Hip fracture Mother   . Dementia Mother   . Coronary artery disease Father 36    CABG  . Coronary artery disease Paternal Aunt     Social History   Social History  . Marital status: Married    Spouse name: N/A  . Number of children: 1  . Years of education: N/A   Occupational History  . 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  Smoking status: Never Smoker  . Smokeless tobacco: Never Used  . Alcohol use Yes     Comment: occasional wine with dinner  . Drug use: No  . Sexual activity: Not on file   Other Topics Concern  . Not on file   Social History Narrative  . No narrative on file    Review of Systems  Constitutional: Negative.   HENT: Negative.   Eyes: Negative.   Respiratory: Negative.   Cardiovascular: Negative.   Gastrointestinal: Negative.   Genitourinary:       Vaginal nodule   Musculoskeletal: Negative.   Skin: Negative.   Neurological: Negative.   Endo/Heme/Allergies: Negative.   Psychiatric/Behavioral: Negative.     PHYSICAL EXAMINATION:    BP 128/70 (BP Location: Right Arm, Patient Position: Sitting, Cuff Size: Normal)   Pulse 84   Resp 14   Wt 144 lb (65.3 kg)   BMI 23.96 kg/m     General  appearance: alert, cooperative and appears stated age  Pelvic: External genitalia:  no lesions, on the upper left buttock, just under the vulva is a 1 cm boil. Mildly tender, not coming to a head, no signs of a surrounding cellulitis              Urethra:  normal appearing urethra with no masses, tenderness or lesions              Bartholins and Skenes: normal                   Chaperone was present for exam.  ASSESSMENT Skin boil on left upper buttock Osteopenia, according to the patient her FRAX risk of a fracture is 12% for the femur and 21-22% of any fracture. She has questions about treatment. Her primary recommended prolia    PLAN Warm compresses, hot soaks, call if not improving or worsening Long discussion about the option of prolia vs reclast. Discussed potential side effects. She is not a candidate for an oral biphosphonate Discussed making sure her lab work was normal, including calcium and vit D Recommended weight bearing exercise, and to avoid situations that increase her risk of falling   An After Visit Summary was printed and given to the patient.  25 minutes face to face time of which over 50% was spent in counseling.   CC: Edman Circle, FNP

## 2017-01-30 ENCOUNTER — Ambulatory Visit (INDEPENDENT_AMBULATORY_CARE_PROVIDER_SITE_OTHER): Payer: Medicare Other | Admitting: Obstetrics and Gynecology

## 2017-01-30 ENCOUNTER — Telehealth: Payer: Self-pay | Admitting: Obstetrics and Gynecology

## 2017-01-30 ENCOUNTER — Encounter: Payer: Self-pay | Admitting: Obstetrics and Gynecology

## 2017-01-30 VITALS — BP 124/60 | HR 84 | Ht 65.0 in

## 2017-01-30 DIAGNOSIS — K610 Anal abscess: Secondary | ICD-10-CM

## 2017-01-30 MED ORDER — DOXYCYCLINE HYCLATE 100 MG PO CAPS: 100.0000 mg | ORAL_CAPSULE | Freq: Two times a day (BID) | ORAL | 0 refills | Status: DC

## 2017-01-30 NOTE — Telephone Encounter (Signed)
Spoke with patient. Patient states she was in to see Dr. Talbert Nan on 2/20 for a red area on labia. Patient states she has a lot of stressors at home with sister in Hospice. Patient states she also has to be at MRI appointment at 13 today. Patient states she has been doing sitz soaks as much as she can, with no changes or relief. Patient states area is still very red and irritated. Patient states she was to return call if not better. Advised patient Dr. Talbert Nan is out of the office today, can schedule with another provider. Patient scheduled 01/30/17 at 2:30pm with Dr. Quincy Simmonds. Patient is agreeable to date and time.  Routing to provider for final review. Patient is agreeable to disposition. Will close encounter.  Cc: Kem Boroughs, NP; Dr. Talbert Nan

## 2017-01-30 NOTE — Telephone Encounter (Signed)
Patient was seen by Dr.Jertson  this week for a "red spot on labia". Patient was told to return to the office if not any better. Patient is aware hat Dr.Jertson is not in the office today.

## 2017-01-30 NOTE — Progress Notes (Signed)
GYNECOLOGY  VISIT   HPI: 76 y.o.   Married  Caucasian  female   G1P1001 with No LMP recorded. Patient is postmenopausal.   here for recheck boil on left buttock which is painful. This has not gotten any better with sitz baths.   Area present for 5 days. Noted it with showering.  Seen by Dr. Talbert Nan 3 days ago and recommended doing sitz baths.  Not been able to doing this very much due to family illness. Area is red, painful but not draining.  No fevers.   No other recurrent lesions on her skin.   Sister is battle end stage pancreatic cancer.   GYNECOLOGIC HISTORY: No LMP recorded. Patient is postmenopausal. Contraception:  Postmenopausal Menopausal hormone therapy:  none Last mammogram:  01-12-17 normal per patient:[facility] Last pap smear:   08-01-08 Negative        OB History    Gravida Para Term Preterm AB Living   1 1 1     1    SAB TAB Ectopic Multiple Live Births                     Patient Active Problem List   Diagnosis Date Noted  . Unspecified hypothyroidism 09/20/2013  . Chest pain radiating to arm 05/25/2012    Past Medical History:  Diagnosis Date  . Colon polyps   . Complication of anesthesia    ALWAYS NAUESA & VOMITING  . 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 Hematuria    negative uro eval X 2  . Hepatitis 1976  . Hyperlipidemia   . Nephrolithiasis   . Osteopenia    dexa 2008  . Thyroid disease     Past Surgical History:  Procedure Laterality Date  . ABDOMINAL HYSTERECTOMY  1996   BSO secondary to AUB and fibroids  . BOTOX INJECTION N/A 03/31/2014   Procedure: BOTOX INJECTION;  Surgeon: Jeryl Columbia, MD;  Location: [facility];  Service: Endoscopy;  Laterality: N/A;  . ESOPHAGEAL MANOMETRY N/A 03/27/2014   Procedure: ESOPHAGEAL MANOMETRY (EM);  Surgeon: Jeryl Columbia, MD;  Location: [facility];  Service: Endoscopy;  Laterality: N/A;  . ESOPHAGOGASTRODUODENOSCOPY N/A 03/31/2014   Procedure: ESOPHAGOGASTRODUODENOSCOPY (EGD);  Surgeon:  Jeryl Columbia, MD;  Location: [facility];  Service: Endoscopy;  Laterality: N/A;  . ESOPHAGOGASTRODUODENOSCOPY ENDOSCOPY  summer 2013   Dr. Watt Climes  . HELLER MYOTOMY  09/07/14  . MOHS SURGERY  09/15/13   nose  . THYROIDECTOMY, PARTIAL  1994  . TONSILLECTOMY      Current Outpatient Prescriptions  Medication Sig Dispense Refill  . Calcium Carbonate (CALCIUM 600 PO) Take by mouth.    . cholecalciferol (VITAMIN D) 1000 UNITS tablet Take 1,000 Units by mouth daily.    Marland Kitchen levothyroxine (SYNTHROID, LEVOTHROID) 112 MCG tablet Take 112 mcg by mouth daily.    . Multiple Vitamin (MULTIVITAMIN) tablet Take 1 tablet by mouth daily.    . temazepam (RESTORIL) 15 MG capsule as needed.     No current facility-administered medications for this visit.      ALLERGIES: Codeine; Penicillins; and Sulfa antibiotics  Family History  Problem Relation Age of Onset  . Hip fracture Mother   . Dementia Mother   . Coronary artery disease Father 40    CABG  . Coronary artery disease Paternal Aunt     Social History   Social History  . Marital status: Married    Spouse name: N/A  . Number of children:  1  . Years of education: N/A   Occupational History  . 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 Smoking status: Never Smoker  . Smokeless tobacco: Never Used  . Alcohol use Yes     Comment: occasional wine with dinner  . Drug use: No  . Sexual activity: Not on file   Other Topics Concern  . Not on file   Social History Narrative  . No narrative on file    ROS:  Pertinent items are noted in HPI.  PHYSICAL EXAMINATION:    BP 124/60 (BP Location: Right Arm, Patient Position: Sitting, Cuff Size: Normal)   Pulse 84   Ht 5\' 5"  (1.651 m)     General appearance: alert, cooperative and appears stated age  Pelvic: External genitalia:  no lesions              Urethra:  normal appearing urethra with no masses, tenderness or lesions              Bartholins and Skenes: normal                  Vagina: normal appearing vagina with normal color and discharge, no lesions              Cervix: no lesions                Bimanual Exam:  Uterus:  normal size, contour, position, consistency, mobility, non-tender              Adnexa: no mass, fullness, tenderness              Rectal exam: Yes.  .  Confirms.  Left perianal/perineal area with 2 cm fluctuant, tender, soft mass which is starting to drain pus through a pin point opening.              Anus:  normal sphincter tone, no lesions  Left perianal abscess incision and drainage. Consent for procedure.  Sterile prep with betadine.  Local 1% lidocaine - lot [id number], esp 02/21, Scalpel used to open the abscess.  Pus drained and would culture performed.  Hemostat used to break up any loculations.  Sterile Qtip with betadine used to cleanse inside of the abscess.  Minimal EBL.  No complications.   Chaperone was present for exam.  ASSESSMENT  Left perianal abscess drained.   PLAN  Follow up would culture.  Placed on Doxycycline 100 mg po bid for one week.  Antibacterial soap and water.  Follow up with me in 3 days or next week.  Call for return for abscess, fever, or malaise.   An After Visit Summary was printed and given to the patient.  __15____ minutes face to face time of which over 50% was spent in counseling.

## 2017-02-02 ENCOUNTER — Ambulatory Visit (INDEPENDENT_AMBULATORY_CARE_PROVIDER_SITE_OTHER): Payer: Medicare Other | Admitting: Obstetrics and Gynecology

## 2017-02-02 ENCOUNTER — Encounter: Payer: Self-pay | Admitting: Obstetrics and Gynecology

## 2017-02-02 VITALS — BP 118/78 | HR 74 | Resp 14 | Ht 65.0 in | Wt 142.0 lb

## 2017-02-02 DIAGNOSIS — K610 Anal abscess: Secondary | ICD-10-CM | POA: Diagnosis not present

## 2017-02-02 LAB — WOUND CULTURE
Gram Stain: NONE SEEN
ORGANISM ID, BACTERIA: NORMAL

## 2017-02-02 NOTE — Patient Instructions (Signed)
Please call if the lump on your bottom gets larger, painful, or if you have a fever.

## 2017-02-02 NOTE — Progress Notes (Signed)
GYNECOLOGY  VISIT   HPI: 76 y.o.   Married  Caucasian  female   G1P1001 with No LMP recorded. Patient is postmenopausal.   here for recheck of left perianal abscess drained 3 days ago in office.  Continued to drain for the following 2 days.  Patient is on Doxycycline 100 mg po bid for one week.  No fevers.  Feels a lump still.   Wound culture - few gram + cocci, consistent with normal skin flora.   Sister, Caren Griffins, passed of pancreatic cancer over the weekend.  Remer Macho is in 2 days.   GYNECOLOGIC HISTORY: No LMP recorded. Patient is postmenopausal. Contraception:  Postmenopausal  Menopausal hormone therapy:  none Last mammogram:  01/12/17 at [facility] per patient Last pap smear:   08/01/08 negative         OB History    Gravida Para Term Preterm AB Living   1 1 1     1    SAB TAB Ectopic Multiple Live Births                     Patient Active Problem List   Diagnosis Date Noted  . Unspecified hypothyroidism 09/20/2013  . Chest pain radiating to arm 05/25/2012    Past Medical History:  Diagnosis Date  . Colon polyps   . Complication of anesthesia    ALWAYS NAUESA & VOMITING  . 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 Hematuria    negative uro eval X 2  . Hepatitis 1976  . Hyperlipidemia   . Nephrolithiasis   . Osteopenia    dexa 2008  . Thyroid disease     Past Surgical History:  Procedure Laterality Date  . ABDOMINAL HYSTERECTOMY  1996   BSO secondary to AUB and fibroids  . BOTOX INJECTION N/A 03/31/2014   Procedure: BOTOX INJECTION;  Surgeon: Jeryl Columbia, MD;  Location: [facility];  Service: Endoscopy;  Laterality: N/A;  . ESOPHAGEAL MANOMETRY N/A 03/27/2014   Procedure: ESOPHAGEAL MANOMETRY (EM);  Surgeon: Jeryl Columbia, MD;  Location: [facility];  Service: Endoscopy;  Laterality: N/A;  . ESOPHAGOGASTRODUODENOSCOPY N/A 03/31/2014   Procedure: ESOPHAGOGASTRODUODENOSCOPY (EGD);  Surgeon: Jeryl Columbia, MD;  Location: [facility];  Service: Endoscopy;   Laterality: N/A;  . ESOPHAGOGASTRODUODENOSCOPY ENDOSCOPY  summer 2013   Dr. Watt Climes  . HELLER MYOTOMY  09/07/14  . MOHS SURGERY  09/15/13   nose  . THYROIDECTOMY, PARTIAL  1994  . TONSILLECTOMY      Current Outpatient Prescriptions  Medication Sig Dispense Refill  . Calcium Carbonate (CALCIUM 600 PO) Take by mouth.    . cholecalciferol (VITAMIN D) 1000 UNITS tablet Take 1,000 Units by mouth daily.    Marland Kitchen doxycycline (VIBRAMYCIN) 100 MG capsule Take 1 capsule (100 mg total) by mouth 2 (two) times daily. Take for 7 days. Take with food as can cause GI distress. 14 capsule 0  . levothyroxine (SYNTHROID, LEVOTHROID) 112 MCG tablet Take 112 mcg by mouth daily.    . Multiple Vitamin (MULTIVITAMIN) tablet Take 1 tablet by mouth daily.    . temazepam (RESTORIL) 15 MG capsule as needed.     No current facility-administered medications for this visit.      ALLERGIES: Codeine; Penicillins; and Sulfa antibiotics  Family History  Problem Relation Age of Onset  . Hip fracture Mother   . Dementia Mother   . Coronary artery disease Father 13    CABG  . Coronary artery disease Paternal Aunt  Social History   Social History  . Marital status: Married    Spouse name: N/A  . Number of children: 1  . Years of education: N/A   Occupational History  . 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 Smoking status: Never Smoker  . Smokeless tobacco: Never Used  . Alcohol use Yes     Comment: occasional wine with dinner  . Drug use: No  . Sexual activity: Not on file   Other Topics Concern  . Not on file   Social History Narrative  . No narrative on file    ROS:  Pertinent items are noted in HPI.  PHYSICAL EXAMINATION:    BP 118/78 (BP Location: Right Arm, Patient Position: Sitting, Cuff Size: Normal)   Pulse 74   Resp 14   Ht 5\' 5"  (1.651 m)   Wt 142 lb (64.4 kg)   BMI 23.63 kg/m     General appearance: alert, cooperative and appears stated age    Pelvic: External  genitalia:  no lesions              Urethra:  normal appearing urethra with no masses, tenderness or lesions                           Left perianal region with 1 cm slightly indurated and nontender region.  Skin opening is now closed.  Not clear if this is small reaculumation of abscess or just induration of the skin from the cellulitis.            Chaperone was present for exam.  ASSESSMENT  Status post I and D of left perianal abscess.  Patient improved.  Bereavement.   PLAN  Continue Doxycycline.  Warm soaks daily.  Follow up in 4 days, sooner as needed if increases in size again.  Support given for loss of sister.   An After Visit Summary was printed and given to the patient.  __15____ minutes face to face time of which over 50% was spent in counseling.

## 2017-02-06 ENCOUNTER — Encounter: Payer: Self-pay | Admitting: Obstetrics and Gynecology

## 2017-02-06 ENCOUNTER — Ambulatory Visit (INDEPENDENT_AMBULATORY_CARE_PROVIDER_SITE_OTHER): Payer: Medicare Other | Admitting: Obstetrics and Gynecology

## 2017-02-06 VITALS — BP 120/80 | HR 80 | Resp 16 | Ht 65.0 in | Wt 144.0 lb

## 2017-02-06 DIAGNOSIS — K611 Rectal abscess: Secondary | ICD-10-CM | POA: Diagnosis not present

## 2017-02-06 NOTE — Progress Notes (Signed)
GYNECOLOGY  VISIT   HPI: 76 y.o.   Married  Caucasian  female   G1P1001 with No LMP recorded. Patient is postmenopausal.   here for  abscess  Follow up.  Has finished Doxycycline. Now with some loose BMs.   Abscess area has gotten smaller.  Is feeling well.  Culture showed normal skin flora.   GYNECOLOGIC HISTORY: No LMP recorded. Patient is postmenopausal. Contraception:  Postmenopausal Menopausal hormone therapy:  none Last mammogram:  01/09/17 3D Density C/Neg/BiRads1:[facility] Last pap smear:   08/01/08 negative          OB History    Gravida Para Term Preterm AB Living   1 1 1     1    SAB TAB Ectopic Multiple Live Births                     Patient Active Problem List   Diagnosis Date Noted  . Unspecified hypothyroidism 09/20/2013  . Chest pain radiating to arm 05/25/2012    Past Medical History:  Diagnosis Date  . Colon polyps   . Complication of anesthesia    ALWAYS NAUESA & VOMITING  . 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 Hematuria    negative uro eval X 2  . Hepatitis 1976  . Hyperlipidemia   . Nephrolithiasis   . Osteopenia    dexa 2008  . Thyroid disease     Past Surgical History:  Procedure Laterality Date  . ABDOMINAL HYSTERECTOMY  1996   BSO secondary to AUB and fibroids  . BOTOX INJECTION N/A 03/31/2014   Procedure: BOTOX INJECTION;  Surgeon: Jeryl Columbia, MD;  Location: [facility];  Service: Endoscopy;  Laterality: N/A;  . ESOPHAGEAL MANOMETRY N/A 03/27/2014   Procedure: ESOPHAGEAL MANOMETRY (EM);  Surgeon: Jeryl Columbia, MD;  Location: [facility];  Service: Endoscopy;  Laterality: N/A;  . ESOPHAGOGASTRODUODENOSCOPY N/A 03/31/2014   Procedure: ESOPHAGOGASTRODUODENOSCOPY (EGD);  Surgeon: Jeryl Columbia, MD;  Location: [facility];  Service: Endoscopy;  Laterality: N/A;  . ESOPHAGOGASTRODUODENOSCOPY ENDOSCOPY  summer 2013   Dr. Watt Climes  . HELLER MYOTOMY  09/07/14  . MOHS SURGERY  09/15/13   nose  . THYROIDECTOMY, PARTIAL  1994  .  TONSILLECTOMY      Current Outpatient Prescriptions  Medication Sig Dispense Refill  . Calcium Carbonate (CALCIUM 600 PO) Take by mouth.    . cholecalciferol (VITAMIN D) 1000 UNITS tablet Take 1,000 Units by mouth daily.    Marland Kitchen doxycycline (VIBRAMYCIN) 100 MG capsule Take 1 capsule (100 mg total) by mouth 2 (two) times daily. Take for 7 days. Take with food as can cause GI distress. 14 capsule 0  . levothyroxine (SYNTHROID, LEVOTHROID) 112 MCG tablet Take 112 mcg by mouth daily.    . Multiple Vitamin (MULTIVITAMIN) tablet Take 1 tablet by mouth daily.    . temazepam (RESTORIL) 15 MG capsule as needed.     No current facility-administered medications for this visit.      ALLERGIES: Codeine; Penicillins; and Sulfa antibiotics  Family History  Problem Relation Age of Onset  . Hip fracture Mother   . Dementia Mother   . Coronary artery disease Father 16    CABG  . Coronary artery disease Paternal Aunt     Social History   Social History  . Marital status: Married    Spouse name: N/A  . Number of children: 1  . Years of education: N/A   Occupational History  . Retired Tourist information centre manager  Social History Main Topics  . Smoking status: Never Smoker  . Smokeless tobacco: Never Used  . Alcohol use Yes     Comment: occasional wine with dinner  . Drug use: No  . Sexual activity: Not on file   Other Topics Concern  . Not on file   Social History Narrative  . No narrative on file    ROS:  Pertinent items are noted in HPI.  PHYSICAL EXAMINATION:    BP 120/80 (BP Location: Right Arm, Patient Position: Sitting, Cuff Size: Normal)   Pulse 80   Resp 16   Ht 5\' 5"  (1.651 m)   Wt 144 lb (65.3 kg)   BMI 23.96 kg/m     General appearance: alert, cooperative and appears stated age   Left perianal region with 7 - 8 mm circular soft, nontender area.  Chaperone was present for exam.  ASSESSMENT  Left perianal abscess resolving with course of Doxycycline.   PLAN  Continue warm  soaks.  Return to office if abscess returns.    An After Visit Summary was printed and given to the patient.  __15____ minutes face to face time of which over 50% was spent in counseling.

## 2017-03-20 ENCOUNTER — Ambulatory Visit: Payer: Medicare Other | Attending: Orthopedic Surgery | Admitting: Physical Therapy

## 2017-03-20 DIAGNOSIS — M25551 Pain in right hip: Secondary | ICD-10-CM | POA: Diagnosis not present

## 2017-03-20 NOTE — Patient Instructions (Addendum)
Can sit to stand without use of UEs with no pain   MMT - position of IR/ER on RLE were painful but the test wasn't painful   SLRs - no pain   Single leg and double leg bridging - caused cramps in HS, no pain in the hip   Hip IR (WNL, very mild pain on RLE, ER WNL -- IR/ER WNL on LLE)  HS - WNL   Prone CPAs- did not reproduce or exacerbate any of her symptoms   Ely's position stretching - painful and limited bilaterally to a clinically significant margin. PT performed Ely's quad stretching -- 2 sets x 15 repetitions of 3-5" oscillations bilaterally.   Educated patient on isometric clamshells with belt around her knees (2 sets x 8 repetitions for 5" holds)   Educated patient on completing BOSU ball single leg stance at the gym, performed x 3 repetitions of 5" holds bilaterally.

## 2017-03-24 ENCOUNTER — Ambulatory Visit: Payer: Medicare Other | Admitting: Physical Therapy

## 2017-03-24 DIAGNOSIS — M25551 Pain in right hip: Secondary | ICD-10-CM | POA: Diagnosis not present

## 2017-03-24 NOTE — Therapy (Signed)
[facility] [address] [place], [address] Phone: [phone]   Fax:  [phone]  Physical Therapy Treatment  Patient Details  Name: Stephanie Davila MRN: [id number] Date of Birth: 1941/02/04 No Data Recorded  Encounter Date: 03/24/2017      PT End of Session - 03/24/17 1822    Visit Number 2   Number of Visits 9   Date for PT Re-Evaluation 05/01/17   PT Start Time 0949   PT Stop Time 1030   PT Time Calculation (min) 41 min   Activity Tolerance Patient tolerated treatment well   Behavior During Therapy [facility] for tasks assessed/performed      Past Medical History:  Diagnosis Date  . Colon polyps   . Complication of anesthesia    ALWAYS NAUESA & VOMITING  . 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 Hematuria    negative uro eval X 2  . Hepatitis 1976  . Hyperlipidemia   . Nephrolithiasis   . Osteopenia    dexa 2008  . Thyroid disease     Past Surgical History:  Procedure Laterality Date  . ABDOMINAL HYSTERECTOMY  1996   BSO secondary to AUB and fibroids  . BOTOX INJECTION N/A 03/31/2014   Procedure: BOTOX INJECTION;  Surgeon: Jeryl Columbia, MD;  Location: [facility];  Service: Endoscopy;  Laterality: N/A;  . ESOPHAGEAL MANOMETRY N/A 03/27/2014   Procedure: ESOPHAGEAL MANOMETRY (EM);  Surgeon: Jeryl Columbia, MD;  Location: [facility];  Service: Endoscopy;  Laterality: N/A;  . ESOPHAGOGASTRODUODENOSCOPY N/A 03/31/2014   Procedure: ESOPHAGOGASTRODUODENOSCOPY (EGD);  Surgeon: Jeryl Columbia, MD;  Location: [facility];  Service: Endoscopy;  Laterality: N/A;  . ESOPHAGOGASTRODUODENOSCOPY ENDOSCOPY  summer 2013   Dr. Watt Climes  . HELLER MYOTOMY  09/07/14  . MOHS SURGERY  09/15/13   nose  . THYROIDECTOMY, PARTIAL  1994  . TONSILLECTOMY      There were no vitals filed for this visit.      Subjective Assessment - 03/24/17 1819    Subjective Subjective- Patient reports she went to the  gym yesterday and performed her HEP. Patient reports she felt great leaving PT on Friday. She reports an increase in pain on Saturday, she has had some pain in the front of her leg and in the groin on her R side.    Limitations Walking   Diagnostic tests X-ray and CT scan of the R hip and lumbar spine -- degenerative changes but nothing acute.    Patient Stated Goals Patient would like to be able to get outside and walk without pain.    Currently in Pain? Other (Comment)  Reports mild pain with some activities in her R hip, though improved from Saturday.        kneeling hip flexor stretch - 2 bouts x 2 minutes of stretching (did have some discomfort from the compression on the knee joint, provided 2 pillows)  Single leg deadlifts x 12 repetitions on blue foam pad for 2 sets  Side stepping with green t-band x 15 for 2 sets  Monster walks forwards/retro - 2 sets x 12 total repetitions   Single leg deadlifts on BOSU with HHA x 1 for 2 sets x 10 repetitions (well tolerated, though more challenging on her balance on RLE > LLE)  PT Education - 04-19-17 1820    Education provided Yes   Education Details Provided update of HEP, perform gym based exercise program.    Person(s) Educated Patient   Methods Explanation;Demonstration;Handout   Comprehension Verbalized understanding;Returned demonstration             PT Long Term Goals - 04-19-17 1810      PT LONG TERM GOAL #1   Title Patient will report LEFS score of greater than 65/80 to demonstrate improved tolerance for ADLs.    Baseline 56/80   Time 6   Period Weeks   Status New     PT LONG TERM GOAL #2   Title Patient will report worst pain of 1/10 to demonstrate improved tolerance for ADLs.    Baseline 5/10   Time 6   Period Weeks   Status New     PT LONG TERM GOAL #3   Title Patient will be able to participate in gym based exercise program with no increase in pain to  demonstrate improved tolerance for recreational activities.    Baseline Pain with prolonged walking and exercise routine   Time 6   Period Weeks   Status New               Plan - 04/19/17 1823    Clinical Impression Statement Patient reports improvement in symptoms with exercise, despite small setback after initial therapy session. She continues to demonstrate decreased motion about R hip (decreased extension) though improved tolerance for higher level balance and strengthening activities. Encouraged gym based exercise program to continue making progress towards less pain and increased ability to complete recreational activities.    Rehab Potential Good   PT Frequency 2x / week   PT Duration 4 weeks   PT Treatment/Interventions Gait training;Stair training;Therapeutic activities;Therapeutic exercise;Balance training;Manual techniques;Dry needling;Cryotherapy;Moist Heat   PT Next Visit Plan Re-assess load tolerance, progress from isometric to NWBing or WBing gluteal strengthening progression.    PT Home Exercise Plan BOSU ball single leg stance, isometric supine clamshells, prone quadricep stretching    Consulted and Agree with Plan of Care Patient      Patient will benefit from skilled therapeutic intervention in order to improve the following deficits and impairments:  Pain, Decreased activity tolerance, Decreased balance, Difficulty walking, Decreased mobility, Decreased endurance, Decreased strength  Visit Diagnosis: Pain in right hip       G-Codes - 04-19-2017 1810    Functional Assessment Tool Used (Outpatient Only) LEFS, VAS, patient report    Functional Limitation Mobility: Walking and moving around   Mobility: Walking and Moving Around Current Status [phone]) At least 20 percent but less than 40 percent impaired, limited or restricted   Mobility: Walking and Moving Around Goal Status [phone]) 0 percent impaired, limited or restricted      Problem List Patient Active  Problem List   Diagnosis Date Noted  . Unspecified hypothyroidism 09/20/2013  . Chest pain radiating to arm 05/25/2012   Royce Macadamia PT, DPT, CSCS    Apr 19, 2017, 6:25 PM  [facility] [address] [place], [address] Phone: [phone]   Fax:  [phone]  Name: Tayli Buch MRN: [id number] Date of Birth: 13-Mar-1941

## 2017-03-24 NOTE — Therapy (Signed)
[facility] [address], [place], [address] Phone: [phone]   Fax:  [phone]  Physical Therapy Evaluation  Patient Details  Name: Stephanie Davila MRN: [id number] Date of Birth: [birth year] No Data Recorded  Encounter Date: 03/20/2017      PT End of Session - 03/24/17 1814    Visit Number 1   Number of Visits 9   Date for PT Re-Evaluation 05/01/17   PT Start Time 4627   PT Stop Time 1400   PT Time Calculation (min) 55 min   Activity Tolerance Patient tolerated treatment well   Behavior During Therapy [facility] for tasks assessed/performed      Past Medical History:  Diagnosis Date  . Colon polyps   . Complication of anesthesia    ALWAYS NAUESA & VOMITING  . 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 Hematuria    negative uro eval X 2  . Hepatitis 1976  . Hyperlipidemia   . Nephrolithiasis   . Osteopenia    dexa 2008  . Thyroid disease     Past Surgical History:  Procedure Laterality Date  . ABDOMINAL HYSTERECTOMY  1996   BSO secondary to AUB and fibroids  . BOTOX INJECTION N/A 03/31/2014   Procedure: BOTOX INJECTION;  Surgeon: Jeryl Columbia, MD;  Location: [facility];  Service: Endoscopy;  Laterality: N/A;  . ESOPHAGEAL MANOMETRY N/A 03/27/2014   Procedure: ESOPHAGEAL MANOMETRY (EM);  Surgeon: Jeryl Columbia, MD;  Location: [facility];  Service: Endoscopy;  Laterality: N/A;  . ESOPHAGOGASTRODUODENOSCOPY N/A 03/31/2014   Procedure: ESOPHAGOGASTRODUODENOSCOPY (EGD);  Surgeon: Jeryl Columbia, MD;  Location: [facility];  Service: Endoscopy;  Laterality: N/A;  . ESOPHAGOGASTRODUODENOSCOPY ENDOSCOPY  summer 2013   Dr. Watt Climes  . HELLER MYOTOMY  09/07/14  . MOHS SURGERY  09/15/13   nose  . THYROIDECTOMY, PARTIAL  1994  . TONSILLECTOMY      There were no vitals filed for this visit.       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 Reports the day before Christmas she was putting on a pair of pants, heard and felt a pop in her R hip. She could not bear weight on her RLE afterwards. She went to the ED afterwards for CT and X ray of the hip. She required use of a RW to aid in her ambulation. She went to see an orthopedist and had an MRI and was diagnosed with a labral tear and "some tendon tears". She has remained active and goes to fitness classes at the [employer]. She has pain occasionally in the groin,, lateral hip, or no pain at all.    Limitations Walking   Diagnostic tests X-ray and CT scan of the R hip and lumbar spine -- degenerative changes but nothing acute.    Patient Stated Goals Patient would like to be able to get outside and walk without pain.    Currently in Pain? Other (Comment)  Has to hobble a little bit in her first few steps. Has pain going from the hip to the knee, sometimes in the posterior portion of the hip, and has pain occasionally in the lateral hip      Can sit to stand without use of UEs with no pain   MMT - position of IR/ER on RLE were painful but the test  wasn't painful   SLRs - no pain   Single leg and double leg bridging - caused cramps in HS, no pain in the hip   Hip IR (WNL, very mild pain on RLE, ER WNL -- IR/ER WNL on LLE)  HS - WNL   Prone CPAs- did not reproduce or exacerbate any of her symptoms   Ely's position stretching - painful and limited bilaterally to a clinically significant margin. PT performed Ely's quad stretching -- 2 sets x 15 repetitions of 3-5" oscillations bilaterally.   Educated patient on isometric clamshells with belt around her knees (2 sets x 8 repetitions for 5" holds)   Educated patient on completing BOSU ball single leg stance at the gym, performed x 3 repetitions of 5" holds bilaterally.                          PT Education - 03/24/17 1814    Education provided Yes    Education Details HEP and likely source(s) of pain being poor control of hip, weakness, gluteal tendinopathy.    Person(s) Educated Patient   Methods Explanation;Demonstration;Handout   Comprehension Verbalized understanding;Returned demonstration             PT Long Term Goals - 03/24/17 1810      PT LONG TERM GOAL #1   Title Patient will report LEFS score of greater than 65/80 to demonstrate improved tolerance for ADLs.    Baseline 56/80   Time 6   Period Weeks   Status New     PT LONG TERM GOAL #2   Title Patient will report worst pain of 1/10 to demonstrate improved tolerance for ADLs.    Baseline 5/10   Time 6   Period Weeks   Status New     PT LONG TERM GOAL #3   Title Patient will be able to participate in gym based exercise program with no increase in pain to demonstrate improved tolerance for recreational activities.    Baseline Pain with prolonged walking and exercise routine   Time 6   Period Weeks   Status New               Plan - 03/24/17 1814    Clinical Impression Statement Patient presents with history of chronic bilateral trochanteric bursitis/gluteal tendinopathy. She appears to have a sub-acute 'tendon tear" per her report, which leads to pain in every direction around her hip. Testing reveals decreased strength and single leg balance on her RLE relative to her LLE. She demonstrates mild range of motion deficits, particularly into knee flexion and hip extension (consistent with decreased rectus femoris length). Patient would benefit from skilled PT services to address her decreased strength and proprioception on her RLE to return to her recreational activities.    Rehab Potential Good   PT Frequency 2x / week   PT Duration 4 weeks   PT Treatment/Interventions Gait training;Stair training;Therapeutic activities;Therapeutic exercise;Balance training;Manual techniques;Dry needling;Cryotherapy;Moist Heat   PT Next Visit Plan Re-assess load tolerance,  progress from isometric to NWBing or WBing gluteal strengthening progression.    PT Home Exercise Plan BOSU ball single leg stance, isometric supine clamshells, prone quadricep stretching    Consulted and Agree with Plan of Care Patient      Patient will benefit from skilled therapeutic intervention in order to improve the following deficits and impairments:  Pain, Decreased activity tolerance, Decreased balance, Difficulty walking, Decreased mobility, Decreased endurance, Decreased strength  Visit Diagnosis:  Pain in right hip - Plan: PT plan of care cert/re-cert      G-Codes - 18/34/37 1810    Functional Assessment Tool Used (Outpatient Only) LEFS, VAS, patient report    Functional Limitation Mobility: Walking and moving around   Mobility: Walking and Moving Around Current Status ([id number]) At least 20 percent but less than 40 percent impaired, limited or restricted   Mobility: Walking and Moving Around Goal Status [id number]) 0 percent impaired, limited or restricted       Problem List Patient Active Problem List   Diagnosis Date Noted  . Unspecified hypothyroidism 09/20/2013  . Chest pain radiating to arm 05/25/2012   Royce Macadamia PT, DPT, CSCS    03/24/2017, 6:16 PM  [facility] [address] [place], [address] Phone: [phone]   Fax:  [phone]  Name: Stephanie Davila MRN: [id number] Date of Birth: 1940-12-21

## 2017-03-27 ENCOUNTER — Ambulatory Visit: Payer: Medicare Other | Admitting: Physical Therapy

## 2017-03-27 DIAGNOSIS — M25551 Pain in right hip: Secondary | ICD-10-CM

## 2017-03-27 NOTE — Patient Instructions (Addendum)
Supine isometric clamshells for pain control 2 setx x 15 repetitions with 5" holds   BOSU Standing hip abductions x 12, x 10   BOSU step ups with marching x 10 per side (blue side up) x 10 -- blue side down   Deadlifts x6 for 2 sets at 20# with kettlebell   BOSU Squats (blue side down) x 10   Squat jumps on total gym level 18, level 22 (x 12, x 5) with blue foam

## 2017-03-31 ENCOUNTER — Ambulatory Visit: Payer: Medicare Other | Admitting: Physical Therapy

## 2017-03-31 DIAGNOSIS — M25551 Pain in right hip: Secondary | ICD-10-CM | POA: Diagnosis not present

## 2017-03-31 NOTE — Therapy (Signed)
[facility] [address] [place], [address] Phone: [phone]   Fax:  [phone]  Physical Therapy Treatment  Patient Details  Name: Stephanie Davila MRN: [id number] Date of Birth: 1940-12-14 No Data Recorded  Encounter Date: 03/31/2017      PT End of Session - 03/31/17 1055    Visit Number 4   Number of Visits 9   Date for PT Re-Evaluation 05/01/17   PT Start Time 6063   Activity Tolerance Patient tolerated treatment well   Behavior During Therapy [facility] for tasks assessed/performed      Past Medical History:  Diagnosis Date  . Colon polyps   . Complication of anesthesia    ALWAYS NAUESA & VOMITING  . 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 Hematuria    negative uro eval X 2  . Hepatitis 1976  . Hyperlipidemia   . Nephrolithiasis   . Osteopenia    dexa 2008  . Thyroid disease     Past Surgical History:  Procedure Laterality Date  . ABDOMINAL HYSTERECTOMY  1996   BSO secondary to AUB and fibroids  . BOTOX INJECTION N/A 03/31/2014   Procedure: BOTOX INJECTION;  Surgeon: Jeryl Columbia, MD;  Location: [facility];  Service: Endoscopy;  Laterality: N/A;  . ESOPHAGEAL MANOMETRY N/A 03/27/2014   Procedure: ESOPHAGEAL MANOMETRY (EM);  Surgeon: Jeryl Columbia, MD;  Location: [facility];  Service: Endoscopy;  Laterality: N/A;  . ESOPHAGOGASTRODUODENOSCOPY N/A 03/31/2014   Procedure: ESOPHAGOGASTRODUODENOSCOPY (EGD);  Surgeon: Jeryl Columbia, MD;  Location: [facility];  Service: Endoscopy;  Laterality: N/A;  . ESOPHAGOGASTRODUODENOSCOPY ENDOSCOPY  summer 2013   Dr. Watt Climes  . HELLER MYOTOMY  09/07/14  . MOHS SURGERY  09/15/13   nose  . THYROIDECTOMY, PARTIAL  1994  . TONSILLECTOMY      There were no vitals filed for this visit.      Subjective Assessment - 03/31/17 1052    Subjective Patient reports her hip pain has been improved. She reports she had thigh pain (in the quad) for about  half a day.    Limitations Walking   Diagnostic tests X-ray and CT scan of the R hip and lumbar spine -- degenerative changes but nothing acute.    Patient Stated Goals Patient would like to be able to get outside and walk without pain.    Currently in Pain? No/denies      SLDL on blue part of BOSU x 12 (progressed to x 10 with eyes closed)  Standing hip abduction machine x8 for 2 sets @ 25# for speed on concentric progressed to 40# for second set   Deadlifts x 8 repetitions @ 20# KBs (speed on concentric) for 2 sets   Jump Squats on TG at level 19 x 10 for 2 sets   Y balance on Blue foam Round 1 x 5, Round 2 x 4 with eyes closed (lateral, posterior, postero-medial)                            PT Education - 03/31/17 1054    Education provided Yes   Education Details Return to typical gym routine.   Person(s) Educated Patient   Methods Demonstration;Explanation   Comprehension Verbalized understanding;Returned demonstration             PT Long Term Goals - 03/24/17 1810      PT LONG TERM GOAL #  1   Title Patient will report LEFS score of greater than 65/80 to demonstrate improved tolerance for ADLs.    Baseline 56/80   Time 6   Period Weeks   Status New     PT LONG TERM GOAL #2   Title Patient will report worst pain of 1/10 to demonstrate improved tolerance for ADLs.    Baseline 5/10   Time 6   Period Weeks   Status New     PT LONG TERM GOAL #3   Title Patient will be able to participate in gym based exercise program with no increase in pain to demonstrate improved tolerance for recreational activities.    Baseline Pain with prolonged walking and exercise routine   Time 6   Period Weeks   Status New               Plan - 03/31/17 1055    Clinical Impression Statement Patient reports her pain during the day is much improved, she has intermittent pain during the night (provided stretching program to address). PT cleared for return to  performing typical gym routine to guage tolerance for previous activities. She has progressed nicely with exercises and balance work to restore more normalized control of LE/trunk during functional activities.    Rehab Potential Good   PT Frequency 2x / week   PT Duration 4 weeks   PT Treatment/Interventions Gait training;Stair training;Therapeutic activities;Therapeutic exercise;Balance training;Manual techniques;Dry needling;Cryotherapy;Moist Heat   PT Next Visit Plan Re-assess load tolerance, progress from isometric to NWBing or WBing gluteal strengthening progression.    PT Home Exercise Plan BOSU ball single leg stance, isometric supine clamshells, prone quadricep stretching    Consulted and Agree with Plan of Care Patient      Patient will benefit from skilled therapeutic intervention in order to improve the following deficits and impairments:  Pain, Decreased activity tolerance, Decreased balance, Difficulty walking, Decreased mobility, Decreased endurance, Decreased strength  Visit Diagnosis: Pain in right hip     Problem List Patient Active Problem List   Diagnosis Date Noted  . Unspecified hypothyroidism 09/20/2013  . Chest pain radiating to arm 05/25/2012   Royce Macadamia PT, DPT, CSCS    03/31/2017, 3:28 PM  [facility] [address] [place], [address] Phone: [phone]   Fax:  [phone]  Name: Stephanie Davila MRN: [id number] Date of Birth: [birth year]

## 2017-03-31 NOTE — Therapy (Signed)
[facility] [address], [place], [address] Phone: [phone]   Fax:  [phone]  Physical Therapy Treatment  Patient Details  Name: Stephanie Davila MRN: [id number] Date of Birth: [birth year] No Data Recorded  Encounter Date: 03/27/2017    Past Medical History:  Diagnosis Date  . Colon polyps   . Complication of anesthesia    ALWAYS NAUESA & VOMITING  . 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 Hematuria    negative uro eval X 2  . Hepatitis 1976  . Hyperlipidemia   . Nephrolithiasis   . Osteopenia    dexa 2008  . Thyroid disease     Past Surgical History:  Procedure Laterality Date  . ABDOMINAL HYSTERECTOMY  1996   BSO secondary to AUB and fibroids  . BOTOX INJECTION N/A 03/31/2014   Procedure: BOTOX INJECTION;  Surgeon: Jeryl Columbia, MD;  Location: [facility];  Service: Endoscopy;  Laterality: N/A;  . ESOPHAGEAL MANOMETRY N/A 03/27/2014   Procedure: ESOPHAGEAL MANOMETRY (EM);  Surgeon: Jeryl Columbia, MD;  Location: [facility];  Service: Endoscopy;  Laterality: N/A;  . ESOPHAGOGASTRODUODENOSCOPY N/A 03/31/2014   Procedure: ESOPHAGOGASTRODUODENOSCOPY (EGD);  Surgeon: Jeryl Columbia, MD;  Location: [facility];  Service: Endoscopy;  Laterality: N/A;  . ESOPHAGOGASTRODUODENOSCOPY ENDOSCOPY  summer 2013   Dr. Watt Climes  . HELLER MYOTOMY  09/07/14  . MOHS SURGERY  09/15/13   nose  . THYROIDECTOMY, PARTIAL  1994  . TONSILLECTOMY      There were no vitals filed for this visit.      Subjective Assessment - 03/31/17 0939    Subjective Patient reports she attended her gym class on Wednesday for "leg day", reports she had increased soreness afterwards. She has also been moving furniture around the house. Reports it is better this morning after resting last night. Reports the pain yesterday was in the buttocks and lateral hip.    Limitations Walking   Diagnostic tests X-ray and CT scan of the  R hip and lumbar spine -- degenerative changes but nothing acute.    Patient Stated Goals Patient would like to be able to get outside and walk without pain.    Currently in Pain? No/denies      Supine isometric clamshells for pain control 2 setx x 15 repetitions with 5" holds   BOSU Standing hip abductions x 12, x 10   BOSU step ups with marching x 10 per side (blue side up) x 10 -- blue side down   Deadlifts x6 for 2 sets at 20# with kettlebell   BOSU Squats (blue side down) x 10   Squat jumps on total gym level 18, level 22 (x 12, x 5) with blue foam                            PT Education - 03/31/17 0937    Education provided Yes   Education Details Provided updated HEP and encouraged patient to continue with return to weightlifting routine.    Person(s) Educated Patient   Methods Explanation;Demonstration;Handout   Comprehension Verbalized understanding;Returned demonstration             PT Long Term Goals - 03/24/17 1810      PT LONG TERM GOAL #1   Title Patient will report LEFS score of greater than 65/80 to demonstrate improved tolerance for ADLs.    Baseline 56/80  Time 6   Period Weeks   Status New     PT LONG TERM GOAL #2   Title Patient will report worst pain of 1/10 to demonstrate improved tolerance for ADLs.    Baseline 5/10   Time 6   Period Weeks   Status New     PT LONG TERM GOAL #3   Title Patient will be able to participate in gym based exercise program with no increase in pain to demonstrate improved tolerance for recreational activities.    Baseline Pain with prolonged walking and exercise routine   Time 6   Period Weeks   Status New               Plan - 03/31/17 0942    Clinical Impression Statement Patient continues to report decrease in symptoms, though continues to have exacerbation with regular gym routine. Her ROM and strength continue to improve in RLE, no pain elicited in this session. Will continue  to work on proprioceptive tasks along with strength/power development in her RLE to allow for return to recreational activities.    Rehab Potential Good   PT Frequency 2x / week   PT Duration 4 weeks   PT Treatment/Interventions Gait training;Stair training;Therapeutic activities;Therapeutic exercise;Balance training;Manual techniques;Dry needling;Cryotherapy;Moist Heat   PT Next Visit Plan Re-assess load tolerance, progress from isometric to NWBing or WBing gluteal strengthening progression.    PT Home Exercise Plan BOSU ball single leg stance, isometric supine clamshells, prone quadricep stretching    Consulted and Agree with Plan of Care Patient      Patient will benefit from skilled therapeutic intervention in order to improve the following deficits and impairments:  Pain, Decreased activity tolerance, Decreased balance, Difficulty walking, Decreased mobility, Decreased endurance, Decreased strength  Visit Diagnosis: Pain in right hip     Problem List Patient Active Problem List   Diagnosis Date Noted  . Unspecified hypothyroidism 09/20/2013  . Chest pain radiating to arm 05/25/2012   Royce Macadamia PT, DPT, CSCS    03/31/2017, 9:42 AM  [facility] [address], [place], [address] Phone: [phone]   Fax:  [phone]  Name: Stephanie Davila MRN: [id number] Date of Birth: 1941-02-01

## 2017-03-31 NOTE — Patient Instructions (Signed)
SLDL on blue part of BOSU x 12 (progressed to x 10 with eyes closed)  Standing hip abduction machine x8 for 2 sets @ 25# for speed on concentric progressed to 40# for second set   Deadlifts x 8 repetitions @ 20# KBs (speed on concentric) for 2 sets   Jump Squats on TG at level 19 x 10 for 2 sets   Y balance on Blue foam Round 1 x 5, Round 2 x 4 with eyes closed

## 2017-04-03 ENCOUNTER — Encounter: Payer: Self-pay | Admitting: Physical Therapy

## 2017-04-03 ENCOUNTER — Ambulatory Visit: Payer: Medicare Other | Admitting: Physical Therapy

## 2017-04-03 DIAGNOSIS — M25551 Pain in right hip: Secondary | ICD-10-CM | POA: Diagnosis not present

## 2017-04-03 NOTE — Therapy (Signed)
[place] [facility] [address], [place], [address] Phone: [phone]   Fax:  [phone]  Physical Therapy Treatment  Patient Details  Name: Stephanie Davila MRN: [id number] Date of Birth: [birth year] No Data Recorded  Encounter Date: 04/03/2017      PT End of Session - 04/03/17 0958    Visit Number 5   Number of Visits 9   Date for PT Re-Evaluation 05/01/17   PT Start Time 0956   PT Stop Time 1045   PT Time Calculation (min) 49 min   Activity Tolerance Patient tolerated treatment well   Behavior During Therapy [facility] for tasks assessed/performed      Past Medical History:  Diagnosis Date  . Colon polyps   . Complication of anesthesia    ALWAYS NAUESA & VOMITING  . 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 Hematuria    negative uro eval X 2  . Hepatitis 1976  . Hyperlipidemia   . Nephrolithiasis   . Osteopenia    dexa 2008  . Thyroid disease     Past Surgical History:  Procedure Laterality Date  . ABDOMINAL HYSTERECTOMY  1996   BSO secondary to AUB and fibroids  . BOTOX INJECTION N/A 03/31/2014   Procedure: BOTOX INJECTION;  Surgeon: Jeryl Columbia, MD;  Location: [facility];  Service: Endoscopy;  Laterality: N/A;  . ESOPHAGEAL MANOMETRY N/A 03/27/2014   Procedure: ESOPHAGEAL MANOMETRY (EM);  Surgeon: Jeryl Columbia, MD;  Location: [facility];  Service: Endoscopy;  Laterality: N/A;  . ESOPHAGOGASTRODUODENOSCOPY N/A 03/31/2014   Procedure: ESOPHAGOGASTRODUODENOSCOPY (EGD);  Surgeon: Jeryl Columbia, MD;  Location: [facility];  Service: Endoscopy;  Laterality: N/A;  . ESOPHAGOGASTRODUODENOSCOPY ENDOSCOPY  summer 2013   Dr. Watt Climes  . HELLER MYOTOMY  09/07/14  . MOHS SURGERY  09/15/13   nose  . THYROIDECTOMY, PARTIAL  1994  . TONSILLECTOMY      There were no vitals filed for this visit.      Subjective Assessment - 04/03/17 0956    Subjective Pt reports she was quite active doing house  cleaning yesterday.  Her pain is only at night when lying trying to fall sleep. Last night in particular her pain was higher in her hip.  Pt was finally able to sleep after elevating her knees and putting the pillows between the knees.   Limitations Walking   Diagnostic tests X-ray and CT scan of the R hip and lumbar spine -- degenerative changes but nothing acute.    Patient Stated Goals Patient would like to be able to get outside and walk without pain.    Currently in Pain? No/denies       TREATMENT  Therapeutic Exercise:    SLDL on blue part of BOSU x 12 (progressed to x 10 with eyes closed)   Paloff press 5# x15 and again with 10# x15   Standing hip abduction machine x8 for 1 set @ 25# each LE for speed on concentric progressed to 40# for second and third set   3 KG lateral medicine ball toss with emphasis on power pushing off RLE and core activation when throwing and catching ball from trampoline. 2x10   Deadlifts x 10 repetitions @ 20# KBs (speed on concentric) for 2 sets   SLS on RLE while bringing LLE forward and back simulating L swing phase of gait cycle. 2 finger support on rail. Repeated on LLE. X2 minutes on each LE  Jump Squats on TG at level 19 x 10 for 3 sets. Noted Bil knee valgus on first set and cues provided for improved mechanics.   Y balance on Blue foam Round 2x5 each LE with eyes closed (lateral, posterior, postero-medial)           PT Education - 04/03/17 0957    Education provided Yes   Education Details Exercise technique, clinical reasoning behind interventions   Person(s) Educated Patient   Methods Explanation;Demonstration;Verbal cues   Comprehension Verbalized understanding;Returned demonstration;Need further instruction             PT Long Term Goals - 03/24/17 1810      PT LONG TERM GOAL #1   Title Patient will report LEFS score of greater than 65/80 to demonstrate improved tolerance for ADLs.    Baseline 56/80   Time 6    Period Weeks   Status New     PT LONG TERM GOAL #2   Title Patient will report worst pain of 1/10 to demonstrate improved tolerance for ADLs.    Baseline 5/10   Time 6   Period Weeks   Status New     PT LONG TERM GOAL #3   Title Patient will be able to participate in gym based exercise program with no increase in pain to demonstrate improved tolerance for recreational activities.    Baseline Pain with prolonged walking and exercise routine   Time 6   Period Weeks   Status New               Plan - 04/03/17 1049    Clinical Impression Statement Pt has returned to her gym routine and reports that she continues to only experience pain at night when preparing to sleep.  She presents without pain and denies pain throughout session.  Pt demonstrated Bil knee valgus with total gym jump squat which improved with verbal cues.  She will benefit from continued skilled PT interventions for improved mechanics, strength, balance, and QOL.    Rehab Potential Good   PT Frequency 2x / week   PT Duration 4 weeks   PT Treatment/Interventions Gait training;Stair training;Therapeutic activities;Therapeutic exercise;Balance training;Manual techniques;Dry needling;Cryotherapy;Moist Heat   PT Next Visit Plan Re-assess load tolerance, progress from isometric to NWBing or WBing gluteal strengthening progression.    PT Home Exercise Plan BOSU ball single leg stance, isometric supine clamshells, prone quadricep stretching    Consulted and Agree with Plan of Care Patient      Patient will benefit from skilled therapeutic intervention in order to improve the following deficits and impairments:  Pain, Decreased activity tolerance, Decreased balance, Difficulty walking, Decreased mobility, Decreased endurance, Decreased strength  Visit Diagnosis: Pain in right hip     Problem List Patient Active Problem List   Diagnosis Date Noted  . Unspecified hypothyroidism 09/20/2013  . Chest pain radiating to  arm 05/25/2012     Collie Siad PT, DPT 04/03/2017, 10:51 AM  [facility] [address], [place], [address] Phone: [phone]   Fax:  [phone]  Name: Nitza Schmid MRN: [id number] Date of Birth: 10-08-1941

## 2017-04-06 ENCOUNTER — Ambulatory Visit: Payer: Medicare Other | Admitting: Physical Therapy

## 2017-04-06 DIAGNOSIS — M25551 Pain in right hip: Secondary | ICD-10-CM

## 2017-04-06 NOTE — Therapy (Signed)
[place] [facility] [address] [place], [address] Phone: [phone]   Fax:  [phone]  Physical Therapy Treatment  Patient Details  Name: Stephanie Davila MRN: [id number] Date of Birth: November 23, 1941 No Data Recorded  Encounter Date: 04/06/2017      PT End of Session - 04/06/17 1038    Visit Number 6   Number of Visits 9   Date for PT Re-Evaluation 05/01/17   PT Start Time 1038   PT Stop Time 1123   PT Time Calculation (min) 45 min   Activity Tolerance Patient tolerated treatment well   Behavior During Therapy [facility] for tasks assessed/performed      Past Medical History:  Diagnosis Date  . Colon polyps   . Complication of anesthesia    ALWAYS NAUESA & VOMITING  . 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 Hematuria    negative uro eval X 2  . Hepatitis 1976  . Hyperlipidemia   . Nephrolithiasis   . Osteopenia    dexa 2008  . Thyroid disease     Past Surgical History:  Procedure Laterality Date  . ABDOMINAL HYSTERECTOMY  1996   BSO secondary to AUB and fibroids  . BOTOX INJECTION N/A 03/31/2014   Procedure: BOTOX INJECTION;  Surgeon: Jeryl Columbia, MD;  Location: [facility];  Service: Endoscopy;  Laterality: N/A;  . ESOPHAGEAL MANOMETRY N/A 03/27/2014   Procedure: ESOPHAGEAL MANOMETRY (EM);  Surgeon: Jeryl Columbia, MD;  Location: [facility];  Service: Endoscopy;  Laterality: N/A;  . ESOPHAGOGASTRODUODENOSCOPY N/A 03/31/2014   Procedure: ESOPHAGOGASTRODUODENOSCOPY (EGD);  Surgeon: Jeryl Columbia, MD;  Location: [facility];  Service: Endoscopy;  Laterality: N/A;  . ESOPHAGOGASTRODUODENOSCOPY ENDOSCOPY  summer 2013   Dr. Watt Climes  . HELLER MYOTOMY  09/07/14  . MOHS SURGERY  09/15/13   nose  . THYROIDECTOMY, PARTIAL  1994  . TONSILLECTOMY      There were no vitals filed for this visit.      Subjective Assessment - 04/06/17 1203    Subjective Pt reports she was quite active doing house  cleaning yesterday.  Her pain is only at night when lying trying to fall sleep. Last night in particular her pain was higher in her hip.  Pt was finally able to sleep after elevating her knees and putting the pillows between the knees.   Limitations Walking   Diagnostic tests X-ray and CT scan of the R hip and lumbar spine -- degenerative changes but nothing acute.    Patient Stated Goals Patient would like to be able to get outside and walk without pain.    Currently in Pain? No/denies      Slump Test - negative bilaterally   SLR- negative bilaterally   MMT of hip IR/ER/Hip flexor/HS - all WNL no pain   Soft tissue mobilization of greater trochanter region (painful and reproductive of her pain) -- educated patient to continue this over the next few days.   Supine isometric clamshells -- 2 sets x 10 repetitions with belt around knees.   Hip IR/ER PROM- no pain and appropriate ROM noted.   Long axis distraction - x 2 minutes (patient reports this had a positive feeling in her hip)   HS-90-90 -- no pain reproduced   Hip Scour - reports this actually felt good.   Repeated extensions in prone -- no  Increase or change in symptoms   Joint mobilizations in lumbar spine - all  negative for hypomobility or pain.   Educated patient on using towel or small blanket underneath her hip while sleeping to reduce contact pressure while sleeping.                            PT Education - 04/06/17 1038    Education provided Yes   Education Details Supine isometric clamshells x 3 per day, towel under hip to decrease contact pressure over greater trochanter, soft tissue mobilization as needed.    Person(s) Educated Patient   Methods Explanation;Demonstration   Comprehension Verbalized understanding;Returned demonstration             PT Long Term Goals - 03/24/17 1810      PT LONG TERM GOAL #1   Title Patient will report LEFS score of greater than 65/80 to  demonstrate improved tolerance for ADLs.    Baseline 56/80   Time 6   Period Weeks   Status New     PT LONG TERM GOAL #2   Title Patient will report worst pain of 1/10 to demonstrate improved tolerance for ADLs.    Baseline 5/10   Time 6   Period Weeks   Status New     PT LONG TERM GOAL #3   Title Patient will be able to participate in gym based exercise program with no increase in pain to demonstrate improved tolerance for recreational activities.    Baseline Pain with prolonged walking and exercise routine   Time 6   Period Weeks   Status New               Plan - 04/06/17 1039    Clinical Impression Statement Patient continues to have pain only at night after lying on her R side. In this session she was screened out for all other contributing factors (slump test, SLR, repeated extensions, joint mobilizations, hip ROM) all of which were negative. 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 Educated patient to increase isometric contractions, add in towel underneath her hip while sleeping to reduce contact pressure over greater trochanter.    Rehab Potential Good   PT Frequency 2x / week   PT Duration 4 weeks   PT Treatment/Interventions Gait training;Stair training;Therapeutic activities;Therapeutic exercise;Balance training;Manual techniques;Dry needling;Cryotherapy;Moist Heat   PT Next Visit Plan Re-assess load tolerance, progress from isometric to NWBing or WBing gluteal strengthening progression.    PT Home Exercise Plan BOSU ball single leg stance, isometric supine clamshells, prone quadricep stretching    Consulted and Agree with Plan of Care Patient      Patient will benefit from skilled therapeutic intervention in order to improve the following deficits and impairments:  Pain, Decreased activity tolerance, Decreased balance, Difficulty walking, Decreased mobility,  Decreased endurance, Decreased strength  Visit Diagnosis: Pain in right hip     Problem List Patient Active Problem List   Diagnosis Date Noted  . Unspecified hypothyroidism 09/20/2013  . Chest pain radiating to arm 05/25/2012   Royce Macadamia PT, DPT, CSCS    04/06/2017, 12:04 PM  [place] [address] [place], [address] Phone: [phone]   Fax:  [phone]  Name: Stephanie Davila MRN: [id number] Date of Birth: [birth year]

## 2017-04-06 NOTE — Patient Instructions (Addendum)
Slump Test - negative bilaterally   SLR- negative bilaterally   MMT of hip IR/ER/Hip flexor/HS - all WNL no pain   Soft tissue mobilization of greater trochanter region (painful and reproductive of her pain)   Supine isometric clamshells -- 2 sets x 10 repetitions with belt around knees.   Hip IR/ER PROM- no pain and appropriate ROM noted.   Long axis distraction   HS-90-90 -- no pain reproduced   Hip Scour - reports this actually felt good.   Repeated extensions in prone -- no  Increase or change in symptoms

## 2017-04-10 ENCOUNTER — Ambulatory Visit: Payer: Medicare Other | Attending: Orthopedic Surgery | Admitting: Physical Therapy

## 2017-04-10 DIAGNOSIS — M25551 Pain in right hip: Secondary | ICD-10-CM

## 2017-04-10 NOTE — Patient Instructions (Signed)
Standing hip abductions on BOSU ball with blue side up and yellow t-band x 12 for 3 sets with HHA

## 2017-04-13 ENCOUNTER — Ambulatory Visit: Payer: Medicare Other | Admitting: Physical Therapy

## 2017-04-13 NOTE — Therapy (Signed)
[facility] [place] [address], [place], [address] Phone: [phone]   Fax:  [phone]  Physical Therapy Treatment  Patient Details  Name: Stephanie Davila MRN: [id number] Date of Birth: [birth year] No Data Recorded  Encounter Date: 04/10/2017      PT End of Session - 04/13/17 1008    Visit Number 7   Number of Visits 9   Date for PT Re-Evaluation 05/01/17   PT Start Time 1117   PT Stop Time 1200   PT Time Calculation (min) 43 min   Activity Tolerance Patient tolerated treatment well   Behavior During Therapy [facility] for tasks assessed/performed      Past Medical History:  Diagnosis Date  . Colon polyps   . Complication of anesthesia    ALWAYS NAUESA & VOMITING  . 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 Hematuria    negative uro eval X 2  . Hepatitis 1976  . Hyperlipidemia   . Nephrolithiasis   . Osteopenia    dexa 2008  . Thyroid disease     Past Surgical History:  Procedure Laterality Date  . ABDOMINAL HYSTERECTOMY  1996   BSO secondary to AUB and fibroids  . BOTOX INJECTION N/A 03/31/2014   Procedure: BOTOX INJECTION;  Surgeon: Jeryl Columbia, MD;  Location: [facility];  Service: Endoscopy;  Laterality: N/A;  . ESOPHAGEAL MANOMETRY N/A 03/27/2014   Procedure: ESOPHAGEAL MANOMETRY (EM);  Surgeon: Jeryl Columbia, MD;  Location: [facility];  Service: Endoscopy;  Laterality: N/A;  . ESOPHAGOGASTRODUODENOSCOPY N/A 03/31/2014   Procedure: ESOPHAGOGASTRODUODENOSCOPY (EGD);  Surgeon: Jeryl Columbia, MD;  Location: [facility];  Service: Endoscopy;  Laterality: N/A;  . ESOPHAGOGASTRODUODENOSCOPY ENDOSCOPY  summer 2013   Dr. Watt Climes  . HELLER MYOTOMY  09/07/14  . MOHS SURGERY  09/15/13   nose  . THYROIDECTOMY, PARTIAL  1994  . TONSILLECTOMY      There were no vitals filed for this visit.      Subjective Assessment - 04/13/17 1006    Subjective Patient reports she has had some discomfort in  her hip, though only occasionally while sleeping. She reports during the day her symptoms are largely non-existent and that she has noticed a significant improvement in her strength.    Limitations Walking   Diagnostic tests X-ray and CT scan of the R hip and lumbar spine -- degenerative changes but nothing acute.    Patient Stated Goals Patient would like to be able to get outside and walk without pain.    Currently in Pain? No/denies      Standing hip abductions on BOSU ball with blue side up and yellow t-band x 12 for 3 sets with HHA   Single leg deadlifts on blue foam x 10 per side for 3 sets with 1 HHA (cuing for bringing posterior leg up)  Lateral Lunges on TRX x 15 for 2 sets (reported as easy)   Lateral band walking with green t-band x 15 for 2 sets (challenging)   Lunges to BOSU ball x 8 for 2 sets with bilateral HHA   Long axis distraction to RLE x 3 bouts x 1 minute per bout, per patient well tolerated.   Belted IR mobilizations at grade III with no reports of pain, reported feeling stretching sensation x 3 bouts x 45" -- well tolerated.  PT Education - 04/13/17 1007    Education provided Yes   Education Details Follow up in 1-2 weeks, continue with strengthening program.    Person(s) Educated Patient   Methods Demonstration;Explanation   Comprehension Verbalized understanding;Returned demonstration             PT Long Term Goals - 03/24/17 1810      PT LONG TERM GOAL #1   Title Patient will report LEFS score of greater than 65/80 to demonstrate improved tolerance for ADLs.    Baseline 56/80   Time 6   Period Weeks   Status New     PT LONG TERM GOAL #2   Title Patient will report worst pain of 1/10 to demonstrate improved tolerance for ADLs.    Baseline 5/10   Time 6   Period Weeks   Status New     PT LONG TERM GOAL #3   Title Patient will be able to participate in gym based exercise program with  no increase in pain to demonstrate improved tolerance for recreational activities.    Baseline Pain with prolonged walking and exercise routine   Time 6   Period Weeks   Status New               Plan - 04/13/17 1011    Clinical Impression Statement Patient has made significant strength gains in R hip, though continues to have discomfort at times at night. It appears this is now more related to activity level (as she gets this more when she performs "leg day" at the gym). Attempted use of towel/positioning for relief, though no real change provided at this point.   Rehab Potential Good   PT Frequency 2x / week   PT Duration 4 weeks   PT Treatment/Interventions Gait training;Stair training;Therapeutic activities;Therapeutic exercise;Balance training;Manual techniques;Dry needling;Cryotherapy;Moist Heat   PT Next Visit Plan Re-assess load tolerance, progress from isometric to NWBing or WBing gluteal strengthening progression.    PT Home Exercise Plan BOSU ball single leg stance, isometric supine clamshells, prone quadricep stretching    Consulted and Agree with Plan of Care Patient      Patient will benefit from skilled therapeutic intervention in order to improve the following deficits and impairments:  Pain, Decreased activity tolerance, Decreased balance, Difficulty walking, Decreased mobility, Decreased endurance, Decreased strength  Visit Diagnosis: Pain in right hip     Problem List Patient Active Problem List   Diagnosis Date Noted  . Unspecified hypothyroidism 09/20/2013  . Chest pain radiating to arm 05/25/2012   Royce Macadamia PT, DPT, CSCS     04/13/2017, 10:15 AM  [place] [facility] [address] [place], [address] Phone: [phone]   Fax:  [phone]  Name: Stephanie Davila MRN: [id number] Date of Birth: [birth year]

## 2017-04-16 ENCOUNTER — Ambulatory Visit: Payer: Medicare Other | Admitting: Physical Therapy

## 2017-04-21 ENCOUNTER — Encounter: Payer: Medicare Other | Admitting: Physical Therapy

## 2017-04-23 ENCOUNTER — Encounter: Payer: Medicare Other | Admitting: Physical Therapy

## 2017-04-27 ENCOUNTER — Ambulatory Visit: Payer: Medicare Other | Admitting: Physical Therapy

## 2017-04-27 DIAGNOSIS — M25551 Pain in right hip: Secondary | ICD-10-CM

## 2017-04-27 NOTE — Patient Instructions (Signed)
LEFS - 74/80   Hip IR mobilizations with belt x 3 minutes (well tolerated, grade III, no discomfort noted excellent ROM)   Long axis distractions x 4 minutes grade III -- well tolerated no discomfort reported.

## 2017-04-27 NOTE — Therapy (Signed)
[facility] [address] [place], [address] Phone: [phone]   Fax:  [phone]  Physical Therapy Treatment  Patient Details  Name: Stephanie Davila MRN: [id number] Date of Birth: 11-30-1941 No Data Recorded  Encounter Date: 04/27/2017      PT End of Session - 04/27/17 1037    Visit Number 8   Number of Visits 9   Date for PT Re-Evaluation 05/01/17   PT Start Time 8588   PT Stop Time 1100   PT Time Calculation (min) 25 min   Activity Tolerance Patient tolerated treatment well   Behavior During Therapy [facility] for tasks assessed/performed      Past Medical History:  Diagnosis Date  . Colon polyps   . Complication of anesthesia    ALWAYS NAUESA & VOMITING  . 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 Hematuria    negative uro eval X 2  . Hepatitis 1976  . Hyperlipidemia   . Nephrolithiasis   . Osteopenia    dexa 2008  . Thyroid disease     Past Surgical History:  Procedure Laterality Date  . ABDOMINAL HYSTERECTOMY  1996   BSO secondary to AUB and fibroids  . BOTOX INJECTION N/A 03/31/2014   Procedure: BOTOX INJECTION;  Surgeon: Jeryl Columbia, MD;  Location: [facility];  Service: Endoscopy;  Laterality: N/A;  . ESOPHAGEAL MANOMETRY N/A 03/27/2014   Procedure: ESOPHAGEAL MANOMETRY (EM);  Surgeon: Jeryl Columbia, MD;  Location: [facility];  Service: Endoscopy;  Laterality: N/A;  . ESOPHAGOGASTRODUODENOSCOPY N/A 03/31/2014   Procedure: ESOPHAGOGASTRODUODENOSCOPY (EGD);  Surgeon: Jeryl Columbia, MD;  Location: [facility];  Service: Endoscopy;  Laterality: N/A;  . ESOPHAGOGASTRODUODENOSCOPY ENDOSCOPY  summer 2013   Dr. Watt Climes  . HELLER MYOTOMY  09/07/14  . MOHS SURGERY  09/15/13   nose  . THYROIDECTOMY, PARTIAL  1994  . TONSILLECTOMY      There were no vitals filed for this visit.      Subjective Assessment - 04/27/17 1036    Subjective Patient reports she has been able to  ascend/descend steps and participate in her regular exercise classes with no real complaints. She has minor pain every now and again, but this is substantially different than her initial set of symptoms.    Limitations Walking   Diagnostic tests X-ray and CT scan of the R hip and lumbar spine -- degenerative changes but nothing acute.    Patient Stated Goals Patient would like to be able to get outside and walk without pain.    Currently in Pain? No/denies      LEFS - 74/80   Hip IR mobilizations with belt x 3 minutes (well tolerated, grade III, no discomfort noted excellent ROM)   Long axis distractions x 4 minutes grade III -- well tolerated no discomfort reported.                            PT Education - 04/27/17 1036    Education provided Yes   Education Details Continue with all activities, no longer requires therapy.    Person(s) Educated Patient   Methods Explanation;Demonstration   Comprehension Returned demonstration;Verbalized understanding             PT Long Term Goals - 04/27/17 1048      PT LONG TERM GOAL #1   Title Patient will report LEFS score of greater than 65/80  to demonstrate improved tolerance for ADLs.    Baseline 56/80   Time 6   Period Weeks   Status Achieved     PT LONG TERM GOAL #2   Title Patient will report worst pain of 1/10 to demonstrate improved tolerance for ADLs.    Baseline 5/10   Time 6   Period Weeks   Status Partially Met     PT LONG TERM GOAL #3   Title Patient will be able to participate in gym based exercise program with no increase in pain to demonstrate improved tolerance for recreational activities.    Baseline Pain with prolonged walking and exercise routine   Time 6   Period Weeks   Status Achieved               Plan - 2017/05/05 1040    Clinical Impression Statement Patient has met or nearly met all rehab goals, and has returned to all recreational physical activities without any  disturbance from RLE. She has progressed nicely with strengthening and has returned to her regular weightlifting classes. She is appropriate for discharge.    Rehab Potential Good   PT Frequency 2x / week   PT Duration 4 weeks   PT Treatment/Interventions Gait training;Stair training;Therapeutic activities;Therapeutic exercise;Balance training;Manual techniques;Dry needling;Cryotherapy;Moist Heat   PT Next Visit Plan Re-assess load tolerance, progress from isometric to NWBing or WBing gluteal strengthening progression.    PT Home Exercise Plan BOSU ball single leg stance, isometric supine clamshells, prone quadricep stretching    Consulted and Agree with Plan of Care Patient      Patient will benefit from skilled therapeutic intervention in order to improve the following deficits and impairments:  Pain, Decreased activity tolerance, Decreased balance, Difficulty walking, Decreased mobility, Decreased endurance, Decreased strength  Visit Diagnosis: Pain in right hip       G-Codes - 05-05-17 1105    Functional Assessment Tool Used (Outpatient Only) LEFS, VAS, patient report    Functional Limitation Mobility: Walking and moving around   Mobility: Walking and Moving Around Current Status [phone]) 0 percent impaired, limited or restricted   Mobility: Walking and Moving Around Goal Status [phone] 0 percent impaired, limited or restricted   Mobility: Walking and Moving Around Discharge Status [phone]) 0 percent impaired, limited or restricted      Problem List Patient Active Problem List   Diagnosis Date Noted  . Unspecified hypothyroidism 09/20/2013  . Chest pain radiating to arm 05/25/2012    Royce Macadamia PT, DPT, CSCS    05-05-2017, 11:06 AM  [facility] [address] [place], [address] Phone: [phone]   Fax:  [phone]  Name: Stephanie Davila MRN: [id number] Date of Birth: Oct 30, 1941

## 2017-05-01 ENCOUNTER — Encounter: Payer: Medicare Other | Admitting: Physical Therapy

## 2017-09-11 ENCOUNTER — Ambulatory Visit (INDEPENDENT_AMBULATORY_CARE_PROVIDER_SITE_OTHER): Payer: Medicare Other | Admitting: Obstetrics and Gynecology

## 2017-09-11 ENCOUNTER — Telehealth: Payer: Self-pay | Admitting: Obstetrics and Gynecology

## 2017-09-11 VITALS — BP 120/60 | HR 66 | Ht 65.0 in | Wt 146.0 lb

## 2017-09-11 DIAGNOSIS — K645 Perianal venous thrombosis: Secondary | ICD-10-CM | POA: Diagnosis not present

## 2017-09-11 NOTE — Progress Notes (Signed)
GYNECOLOGY  VISIT   HPI: 76 y.o.   Married  Caucasian  female   G1P1001 with No LMP recorded. Patient is postmenopausal.   here for possible perianal abscess on right.  Symptoms started 4 days ago.  Using aloe and witch hazel which improves the pain.   No drainage or fevers.   Did a lot of yard in [place] and not sure if this caused the problem.   Hx of perianal abscess treated with Doxycycline in February 2018.  GYNECOLOGIC HISTORY: No LMP recorded. Patient is postmenopausal. Contraception: Postmenopausal Menopausal hormone therapy:  none Last mammogram: 01/09/17 3D Density C/Neg/BiRads1:[facility] Last pap smear: 08-01-08 Neg        OB History    Gravida Para Term Preterm AB Living   1 1 1     1    SAB TAB Ectopic Multiple Live Births                     Patient Active Problem List   Diagnosis Date Noted  . Unspecified hypothyroidism 09/20/2013  . Chest pain radiating to arm 05/25/2012    Past Medical History:  Diagnosis Date  . Colon polyps   . Complication of anesthesia    ALWAYS NAUESA & VOMITING  . 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 Hematuria    negative uro eval X 2  . Hepatitis 1976  . Hyperlipidemia   . Nephrolithiasis   . Osteopenia    dexa 2008  . Thyroid disease     Past Surgical History:  Procedure Laterality Date  . ABDOMINAL HYSTERECTOMY  1996   BSO secondary to AUB and fibroids  . BOTOX INJECTION N/A 03/31/2014   Procedure: BOTOX INJECTION;  Surgeon: Jeryl Columbia, MD;  Location: [facility];  Service: Endoscopy;  Laterality: N/A;  . ESOPHAGEAL MANOMETRY N/A 03/27/2014   Procedure: ESOPHAGEAL MANOMETRY (EM);  Surgeon: Jeryl Columbia, MD;  Location: [facility];  Service: Endoscopy;  Laterality: N/A;  . ESOPHAGOGASTRODUODENOSCOPY N/A 03/31/2014   Procedure: ESOPHAGOGASTRODUODENOSCOPY (EGD);  Surgeon: Jeryl Columbia, MD;  Location: [facility];  Service: Endoscopy;  Laterality: N/A;  . ESOPHAGOGASTRODUODENOSCOPY ENDOSCOPY  summer  2013   Dr. Watt Climes  . HELLER MYOTOMY  09/07/14  . MOHS SURGERY  09/15/13   nose  . THYROIDECTOMY, PARTIAL  1994  . TONSILLECTOMY      Current Outpatient Prescriptions  Medication Sig Dispense Refill  . Calcium Carbonate (CALCIUM 600 PO) Take by mouth.    . cholecalciferol (VITAMIN D) 1000 UNITS tablet Take 1,000 Units by mouth daily.    Marland Kitchen levothyroxine (SYNTHROID, LEVOTHROID) 112 MCG tablet Take 112 mcg by mouth daily.    . Multiple Vitamin (MULTIVITAMIN) tablet Take 1 tablet by mouth daily.    . temazepam (RESTORIL) 15 MG capsule as needed.     No current facility-administered medications for this visit.      ALLERGIES: Codeine; Penicillins; and Sulfa antibiotics  Family History  Problem Relation Age of Onset  . Hip fracture Mother   . Dementia Mother   . Coronary artery disease Father 42       CABG  . Coronary artery disease Paternal Aunt     Social History   Social History  . Marital status: Married    Spouse name: N/A  . Number of children: 1  . Years of education: N/A   Occupational History  . 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 Smoking status: Never Smoker  .  Smokeless tobacco: Never Used  . Alcohol use Yes     Comment: occasional wine with dinner  . Drug use: No  . Sexual activity: Not on file   Other Topics Concern  . Not on file   Social History Narrative  . No narrative on file    ROS:  Pertinent items are noted in HPI.  PHYSICAL EXAMINATION:    BP 120/60 (BP Location: Right Arm, Patient Position: Sitting, Cuff Size: Normal)   Pulse 66   Ht 5\' 5"  (1.651 m)   Wt 146 lb (66.2 kg)   BMI 24.30 kg/m     General appearance: alert, cooperative and appears stated age    Anus:  Thrombosed hemorrhoid noted at 8:00.   I and D thrombosed hemorrhoid. Consent for procedure.  Sterile prep with betadine.  Local 1% lidocaine - lot [id number], exp 12/2018. Scalpel used to open the skin and clot extracted. Minimal EBL noted.  No  complications.   Chaperone was present for exam.  ASSESSMENT  Thrombosed hemorrhoid - drained.   PLAN  Discussed thrombosed hemorrhoid. Colace 100 mg q hs.  Sitz baths.  Aloe wipes.  Return for fever, heavy bleeding or pain. FU in 1 - 2 week for annual exam and recheck.    An After Visit Summary was printed and given to the patient.  _15_____ minutes face to face time of which over 50% was spent in counseling.

## 2017-09-11 NOTE — Telephone Encounter (Signed)
Dr Quincy Simmonds would like patient to be seen 1-2 weeks from today for aex. No aex appointments available.

## 2017-09-11 NOTE — Patient Instructions (Signed)
1.  Take Colace 100 mg by mouth at night to soften the stool.  2.  Use Aloe wipes to cleanse the rectum after having a bowel movement.   3.  A warm sitz bath is a good way to cleanse your bottom and provide some pain relief.  4.  Contact the office if you have any problems with fever, heavy bleeding, or increasing pain.

## 2017-09-11 NOTE — Telephone Encounter (Signed)
Spoke with patient. Reports cyst, right side of perineum to rectal area. Noticed 4 days ago, has been treating as hemorrhoid. Witch hazel and aloe wipes have provided some comfort. Denies drainage, reports area is "hard". Has increased exercise recently and been more active outside.   Recommended OV for further evaluation, scheduled for OV today with Dr. Quincy Simmonds at 2pm. Patient is agreeable to date and time.   Routing to provider for final review. Patient is agreeable to disposition. Will close encounter.

## 2017-09-11 NOTE — Telephone Encounter (Signed)
Left message to call Kaitlyn at [phone]. 

## 2017-09-11 NOTE — Telephone Encounter (Signed)
Patient has a sore spot that may be cyst near her rectum she is concerned about. She said she'd prefer to see Dr. Quincy Simmonds if possible.

## 2017-09-12 ENCOUNTER — Encounter: Payer: Self-pay | Admitting: Obstetrics and Gynecology

## 2017-09-16 NOTE — Telephone Encounter (Signed)
Left message with spouse to call Sharee Pimple at [facility] at [phone].

## 2017-09-16 NOTE — Telephone Encounter (Signed)
Spoke with patient. Scheduled for AEX on 09/17/17 at 1pm with Dr. Quincy Simmonds. Patient declined appointment offered on 10/12 and 10/19. Patient verbalizes understanding and is agreeable.  Routing to provider for final review. Patient is agreeable to disposition. Will close encounter.

## 2017-09-17 ENCOUNTER — Ambulatory Visit: Payer: Self-pay | Admitting: Obstetrics and Gynecology

## 2017-09-17 NOTE — Progress Notes (Deleted)
76 y.o. G70P1001 Married Caucasian female here for annual exam.    PCP:     No LMP recorded. Patient is postmenopausal.           Sexually active: {yes no:314532}  The current method of family planning is post menopausal status/Hysterectomy.    Exercising: {yes no:314532}  {types:19826} Smoker:  no  Health Maintenance: Pap: 08-01-08 Neg History of abnormal Pap:  no MMG: 01/09/17 3D Density C/Neg/BiRads1:[facility] Colonoscopy: ***2010 polyp;next due 2015?? BMD:   ***?2011  Result ***?normal Dr.[place] TDaP:  09-12-11 Gardasil:   no HIV:*** Hep C:*** Screening Labs:  Hb today: ***, Urine today: ***   reports that she has never smoked. She has never used smokeless tobacco. She reports that she drinks alcohol. She reports that she does not use drugs.  Past Medical History:  Diagnosis Date  . Colon polyps   . Complication of anesthesia    ALWAYS NAUESA & VOMITING  . 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 Hematuria    negative uro eval X 2  . Hepatitis 1976  . Hyperlipidemia   . Nephrolithiasis   . Osteopenia    dexa 2008  . Thyroid disease     Past Surgical History:  Procedure Laterality Date  . ABDOMINAL HYSTERECTOMY  1996   BSO secondary to AUB and fibroids  . BOTOX INJECTION N/A 03/31/2014   Procedure: BOTOX INJECTION;  Surgeon: Jeryl Columbia, MD;  Location: [facility];  Service: Endoscopy;  Laterality: N/A;  . ESOPHAGEAL MANOMETRY N/A 03/27/2014   Procedure: ESOPHAGEAL MANOMETRY (EM);  Surgeon: Jeryl Columbia, MD;  Location: [facility];  Service: Endoscopy;  Laterality: N/A;  . ESOPHAGOGASTRODUODENOSCOPY N/A 03/31/2014   Procedure: ESOPHAGOGASTRODUODENOSCOPY (EGD);  Surgeon: Jeryl Columbia, MD;  Location: [facility];  Service: Endoscopy;  Laterality: N/A;  . ESOPHAGOGASTRODUODENOSCOPY ENDOSCOPY  summer 2013   Dr. Watt Climes  . HELLER MYOTOMY  09/07/14  . MOHS SURGERY  09/15/13   nose  . THYROIDECTOMY, PARTIAL  1994  . TONSILLECTOMY      Current Outpatient  Prescriptions  Medication Sig Dispense Refill  . Calcium Carbonate (CALCIUM 600 PO) Take by mouth.    . cholecalciferol (VITAMIN D) 1000 UNITS tablet Take 1,000 Units by mouth daily.    Marland Kitchen levothyroxine (SYNTHROID, LEVOTHROID) 112 MCG tablet Take 112 mcg by mouth daily.    . Multiple Vitamin (MULTIVITAMIN) tablet Take 1 tablet by mouth daily.    . temazepam (RESTORIL) 15 MG capsule as needed.     No current facility-administered medications for this visit.     Family History  Problem Relation Age of Onset  . Hip fracture Mother   . Dementia Mother   . Coronary artery disease Father 76       CABG  . Coronary artery disease Paternal Aunt     ROS:  Pertinent items are noted in HPI.  Otherwise, a comprehensive ROS was negative.  Exam:   There were no vitals taken for this visit.    General appearance: alert, cooperative and appears stated age Head: Normocephalic, without obvious abnormality, atraumatic Neck: no adenopathy, supple, symmetrical, trachea midline and thyroid normal to inspection and palpation Lungs: clear to auscultation bilaterally Breasts: normal appearance, no masses or tenderness, No nipple retraction or dimpling, No nipple discharge or bleeding, No axillary or supraclavicular adenopathy Heart: regular rate and rhythm Abdomen: soft, non-tender; no masses, no organomegaly Extremities: extremities normal, atraumatic, no cyanosis or edema Skin: Skin color, texture, turgor normal. No rashes  or lesions Lymph nodes: Cervical, supraclavicular, and axillary nodes normal. No abnormal inguinal nodes palpated Neurologic: Grossly normal  Pelvic: External genitalia:  no lesions              Urethra:  normal appearing urethra with no masses, tenderness or lesions              Bartholins and Skenes: normal                 Vagina: normal appearing vagina with normal color and discharge, no lesions              Cervix: no lesions              Pap taken: {yes no:314532} Bimanual  Exam:  Uterus:  normal size, contour, position, consistency, mobility, non-tender              Adnexa: no mass, fullness, tenderness              Rectal exam: {yes no:314532}.  Confirms.              Anus:  normal sphincter tone, no lesions  Chaperone was present for exam.  Assessment:   Well woman visit with normal exam.   Plan: Mammogram screening discussed. Recommended self breast awareness. Pap and HR HPV as above. Guidelines for Calcium, Vitamin D, regular exercise program including cardiovascular and weight bearing exercise.   Follow up annually and prn.   Additional counseling given.  {yes [id number]. _______ minutes face to face time of which over 50% was spent in counseling.    After visit summary provided.

## 2017-09-18 ENCOUNTER — Encounter: Payer: Self-pay | Admitting: Obstetrics and Gynecology

## 2017-09-18 ENCOUNTER — Ambulatory Visit (INDEPENDENT_AMBULATORY_CARE_PROVIDER_SITE_OTHER): Payer: Medicare Other | Admitting: Obstetrics and Gynecology

## 2017-09-18 VITALS — BP 136/74 | HR 74 | Ht 65.0 in | Wt 148.0 lb

## 2017-09-18 DIAGNOSIS — K645 Perianal venous thrombosis: Secondary | ICD-10-CM

## 2017-09-18 MED ORDER — HYDROCORTISONE ACE-PRAMOXINE 1-1 % RE FOAM: 1.0000 | Freq: Two times a day (BID) | RECTAL | 0 refills | Status: DC

## 2017-09-18 NOTE — Progress Notes (Signed)
GYNECOLOGY  VISIT   HPI: 76 y.o.   Married  Caucasian  female   G1P1001 with No LMP recorded. Patient is postmenopausal.   here for 2 week follow up.    Hemorrhoid is feeling better but still feels a little lumpy.  Using Tucks medicated pads.   GYNECOLOGIC HISTORY: No LMP recorded. Patient is postmenopausal. Contraception:  Postmenopausal Menopausal hormone therapy: none Last mammogram:   01/09/17 3D Density C/Neg/BiRads1:[facility] Last pap smear: 08-01-08 Neg        OB History    Gravida Para Term Preterm AB Living   1 1 1     1    SAB TAB Ectopic Multiple Live Births                     Patient Active Problem List   Diagnosis Date Noted  . Unspecified hypothyroidism 09/20/2013  . Chest pain radiating to arm 05/25/2012    Past Medical History:  Diagnosis Date  . Colon polyps   . Complication of anesthesia    ALWAYS NAUESA & VOMITING  . 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 Hematuria    negative uro eval X 2  . Hepatitis 1976  . Hyperlipidemia   . Nephrolithiasis   . Osteopenia    dexa 2008  . Thyroid disease     Past Surgical History:  Procedure Laterality Date  . ABDOMINAL HYSTERECTOMY  1996   BSO secondary to AUB and fibroids  . BOTOX INJECTION N/A 03/31/2014   Procedure: BOTOX INJECTION;  Surgeon: Jeryl Columbia, MD;  Location: [facility];  Service: Endoscopy;  Laterality: N/A;  . ESOPHAGEAL MANOMETRY N/A 03/27/2014   Procedure: ESOPHAGEAL MANOMETRY (EM);  Surgeon: Jeryl Columbia, MD;  Location: [facility];  Service: Endoscopy;  Laterality: N/A;  . ESOPHAGOGASTRODUODENOSCOPY N/A 03/31/2014   Procedure: ESOPHAGOGASTRODUODENOSCOPY (EGD);  Surgeon: Jeryl Columbia, MD;  Location: [facility];  Service: Endoscopy;  Laterality: N/A;  . ESOPHAGOGASTRODUODENOSCOPY ENDOSCOPY  summer 2013   Dr. Watt Climes  . HELLER MYOTOMY  09/07/14  . MOHS SURGERY  09/15/13   nose  . THYROIDECTOMY, PARTIAL  1994  . TONSILLECTOMY      Current Outpatient Prescriptions   Medication Sig Dispense Refill  . Calcium Carbonate (CALCIUM 600 PO) Take by mouth.    . cholecalciferol (VITAMIN D) 1000 UNITS tablet Take 1,000 Units by mouth daily.    Marland Kitchen levothyroxine (SYNTHROID, LEVOTHROID) 112 MCG tablet Take 112 mcg by mouth daily.    . Multiple Vitamin (MULTIVITAMIN) tablet Take 1 tablet by mouth daily.    . temazepam (RESTORIL) 15 MG capsule as needed.     No current facility-administered medications for this visit.      ALLERGIES: Codeine; Penicillins; and Sulfa antibiotics  Family History  Problem Relation Age of Onset  . Hip fracture Mother   . Dementia Mother   . Coronary artery disease Father 75       CABG  . Coronary artery disease Paternal Aunt     Social History   Social History  . Marital status: Married    Spouse name: N/A  . Number of children: 1  . Years of education: N/A   Occupational History  . 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 Smoking status: Never Smoker  . Smokeless tobacco: Never Used  . Alcohol use Yes     Comment: occasional wine with dinner  . Drug use: No  . Sexual activity: Not on  file   Other Topics Concern  . Not on file   Social History Narrative  . No narrative on file    ROS:  Pertinent items are noted in HPI.  PHYSICAL EXAMINATION:    BP 136/74 (BP Location: Right Arm, Patient Position: Sitting, Cuff Size: Normal)   Pulse 74   Ht 5\' 5"  (1.651 m)   Wt 148 lb (67.1 kg)   BMI 24.63 kg/m     General appearance: alert, cooperative and appears stated age                Rectal exam: Yes.  .  Confirms.              Anus:  normal sphincter tone,  5 mm right cystic lump at the 8:00 position just outside the anus.  Skin intact. No violaceous color.  Chaperone was present for exam.  ASSESSMENT  Status post drainage of thrombosed hemorrhoid. Still a little enlarged but no evidence of thrombus.   PLAN  Will do observational management.  Use stool softeners, increase fiber in diet,  yogurt.  Proctofoam to area bid prn.  Follow up for annual exam and will recheck in November.    An After Visit Summary was printed and given to the patient.  __15____ minutes face to face time of which over 50% was spent in counseling.

## 2017-10-26 ENCOUNTER — Ambulatory Visit: Payer: Self-pay | Admitting: Obstetrics and Gynecology

## 2017-11-03 ENCOUNTER — Telehealth: Payer: Self-pay | Admitting: Obstetrics and Gynecology

## 2017-11-03 NOTE — Telephone Encounter (Signed)
Left message on voicemail to call and reschedule cancelled appointment. °

## 2017-11-06 ENCOUNTER — Institutional Professional Consult (permissible substitution): Payer: Self-pay | Admitting: Obstetrics and Gynecology

## 2017-11-27 ENCOUNTER — Telehealth: Payer: Self-pay

## 2017-11-27 NOTE — Telephone Encounter (Signed)
Spoke with patient to reschedule 12-09-17 AEX and follow up on hemorrhoid because Dr.Silva out of office due to family emergency. Patient states hemorrhoid doing better and prefers to wait and see Dr. Quincy Simmonds. Placed on waitlist.

## 2017-12-09 ENCOUNTER — Institutional Professional Consult (permissible substitution): Payer: Self-pay | Admitting: Obstetrics and Gynecology

## 2018-02-02 ENCOUNTER — Encounter: Payer: Self-pay | Admitting: Obstetrics and Gynecology

## 2018-02-02 ENCOUNTER — Other Ambulatory Visit: Payer: Self-pay

## 2018-02-02 ENCOUNTER — Ambulatory Visit (INDEPENDENT_AMBULATORY_CARE_PROVIDER_SITE_OTHER): Payer: Medicare Other | Admitting: Obstetrics and Gynecology

## 2018-02-02 VITALS — BP 124/72 | HR 64 | Resp 12 | Ht 64.0 in | Wt 146.0 lb

## 2018-02-02 DIAGNOSIS — Z01419 Encounter for gynecological examination (general) (routine) without abnormal findings: Secondary | ICD-10-CM

## 2018-02-02 NOTE — Progress Notes (Signed)
77 y.o. G27P1001 Married Caucasian female here for annual exam.    PCP wants to treat her osteoporosis with Prolia. She has declined.  Had a dental implant last year and had some problems.   PCP:  Dr. Reynold Bowen  No LMP recorded. Patient is postmenopausal.           Sexually active: Yes.    The current method of family planning is post menopausal status.    Exercising: Yes.    Gym/ health club routine includes aerobics. Smoker:  no  Health Maintenance: Pap:  08/01/08 negative  History of abnormal Pap:  no MMG:  01/09/17 density C/negative/BIRADS 1: [facility].  Has appointment today. Colonoscopy:  05/01/17 polyps- [facility].  Does not need another due to age. BMD:   2017   Result  Osteoporosis per patient- done with PCP  TDaP:  09/12/11  Screening Labs:  Hb today: PCP, Urine today: PCP   reports that  has never smoked. she has never used smokeless tobacco. She reports that she drinks alcohol. She reports that she does not use drugs.  Past Medical History:  Diagnosis Date  . Colon polyps   . Complication of anesthesia    ALWAYS NAUESA & VOMITING  . 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 Hematuria    negative uro eval X 2  . Hepatitis 1976  . Hyperlipidemia   . Nephrolithiasis   . Osteopenia    dexa 2008  . Thyroid disease     Past Surgical History:  Procedure Laterality Date  . ABDOMINAL HYSTERECTOMY  1996   BSO secondary to AUB and fibroids  . BOTOX INJECTION N/A 03/31/2014   Procedure: BOTOX INJECTION;  Surgeon: Jeryl Columbia, MD;  Location: [facility];  Service: Endoscopy;  Laterality: N/A;  . ESOPHAGEAL MANOMETRY N/A 03/27/2014   Procedure: ESOPHAGEAL MANOMETRY (EM);  Surgeon: Jeryl Columbia, MD;  Location: [facility];  Service: Endoscopy;  Laterality: N/A;  . ESOPHAGOGASTRODUODENOSCOPY N/A 03/31/2014   Procedure: ESOPHAGOGASTRODUODENOSCOPY (EGD);  Surgeon: Jeryl Columbia, MD;  Location: [facility];  Service: Endoscopy;  Laterality: N/A;  .  ESOPHAGOGASTRODUODENOSCOPY ENDOSCOPY  summer 2013   Dr. Watt Climes  . HELLER MYOTOMY  09/07/14  . MOHS SURGERY  09/15/13   nose  . THYROIDECTOMY, PARTIAL  1994  . TONSILLECTOMY      Current Outpatient Medications  Medication Sig Dispense Refill  . acetaminophen (TYLENOL) 160 MG/5ML solution Take 650 mg by mouth as needed.     . Calcium Carbonate (CALCIUM 600 PO) Take by mouth.    . cholecalciferol (VITAMIN D) 1000 UNITS tablet Take 1,000 Units by mouth daily.    . Ibuprofen (ADVIL PO) Take by mouth.    . levothyroxine (SYNTHROID, LEVOTHROID) 112 MCG tablet Take 112 mcg by mouth daily.    . Multiple Vitamin (MULTIVITAMIN) tablet Take 1 tablet by mouth daily.    . temazepam (RESTORIL) 15 MG capsule as needed.     No current facility-administered medications for this visit.     Family History  Problem Relation Age of Onset  . Hip fracture Mother   . Dementia Mother   . Coronary artery disease Father 40       CABG  . Coronary artery disease Paternal Aunt     ROS:  Pertinent items are noted in HPI.  Otherwise, a comprehensive ROS was negative.  Exam:   BP 124/72 (BP Location: Right Arm, Patient Position: Sitting, Cuff Size: Normal)   Pulse 64  Resp 12   Ht 5\' 4"  (1.626 m)   Wt 146 lb (66.2 kg)   BMI 25.06 kg/m     General appearance: alert, cooperative and appears stated age Head: Normocephalic, without obvious abnormality, atraumatic Neck: no adenopathy, supple, symmetrical, trachea midline and thyroid normal to inspection and palpation Lungs: clear to auscultation bilaterally Breasts: normal appearance, no masses or tenderness, No nipple retraction or dimpling, No nipple discharge or bleeding, No axillary or supraclavicular adenopathy Heart: regular rate and rhythm Abdomen: soft, non-tender; no masses, no organomegaly Extremities: extremities normal, atraumatic, no cyanosis or edema Skin: Skin color, texture, turgor normal. No rashes or lesions Lymph nodes: Cervical,  supraclavicular, and axillary nodes normal. No abnormal inguinal nodes palpated Neurologic: Grossly normal  Pelvic: External genitalia:  no lesions              Urethra:  normal appearing urethra with no masses, tenderness or lesions              Bartholins and Skenes: normal                 Vagina: normal appearing vagina with normal color and discharge, no lesions.  Atrophy noted.              Cervix:  absent              Pap taken: No. Bimanual Exam:  Uterus:  absent              Adnexa: no mass, fullness, tenderness              Rectal exam: Yes.  .  Confirms.              Anus:  normal sphincter tone, no lesions  Chaperone was present for exam.  Assessment:   Well woman visit with normal exam. Status post TVH/BSO.  Prior ERT use.  Atrophic vaginitis.  Hx thrombosed hemorrhoid. Osteoporosis.  Untreated.  Plan: Mammogram screening discussed.  Appt today. Recommended self breast awareness. Pap and HR HPV as above. Guidelines for Calcium, Vitamin D, regular exercise program including cardiovascular and weight bearing exercise. I discussed Prolia with patient, weight bearing exercise, Ca/vit D, and reducing risk of falls.  Her PCP is following her for this. Follow up annually and prn.    After visit summary provided.

## 2018-02-02 NOTE — Patient Instructions (Signed)

## 2018-02-26 DIAGNOSIS — M65321 Trigger finger, right index finger: Secondary | ICD-10-CM | POA: Insufficient documentation

## 2018-06-09 ENCOUNTER — Encounter

## 2018-06-09 ENCOUNTER — Ambulatory Visit: Payer: Medicare Other | Admitting: Podiatry

## 2018-06-09 ENCOUNTER — Encounter: Payer: Self-pay | Admitting: Podiatry

## 2018-06-09 DIAGNOSIS — L84 Corns and callosities: Secondary | ICD-10-CM

## 2018-06-09 NOTE — Progress Notes (Signed)
Subjective:  Patient ID: Stephanie Davila, female    DOB: 10-17-1941,  MRN: [id number] HPI Chief Complaint  Patient presents with  . Callouses    Patient presents today for callous on left heel. She reports her pedicurist told her she had a wart on bottom of foot and she dug it out.  She only has an occoasional pain when walking.  She has been soaking in Epson salt and using Aloe get    77 y.o. female presents with the above complaint.   ROS: Denies fever chills nausea vomiting muscle aches pains calf pain back pain chest pain shortness of breath.  Past Medical History:  Diagnosis Date  . Colon polyps   . Complication of anesthesia    ALWAYS NAUESA & VOMITING  . 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 Hematuria    negative uro eval X 2  . Hepatitis 1976  . Hyperlipidemia   . Nephrolithiasis   . Osteopenia    dexa 2008  . Thyroid disease    Past Surgical History:  Procedure Laterality Date  . ABDOMINAL HYSTERECTOMY  1996   BSO secondary to AUB and fibroids  . BOTOX INJECTION N/A 03/31/2014   Procedure: BOTOX INJECTION;  Surgeon: Jeryl Columbia, MD;  Location: [facility];  Service: Endoscopy;  Laterality: N/A;  . ESOPHAGEAL MANOMETRY N/A 03/27/2014   Procedure: ESOPHAGEAL MANOMETRY (EM);  Surgeon: Jeryl Columbia, MD;  Location: [facility];  Service: Endoscopy;  Laterality: N/A;  . ESOPHAGOGASTRODUODENOSCOPY N/A 03/31/2014   Procedure: ESOPHAGOGASTRODUODENOSCOPY (EGD);  Surgeon: Jeryl Columbia, MD;  Location: [facility];  Service: Endoscopy;  Laterality: N/A;  . ESOPHAGOGASTRODUODENOSCOPY ENDOSCOPY  summer 2013   Dr. Watt Climes  . HELLER MYOTOMY  09/07/14  . MOHS SURGERY  09/15/13   nose  . THYROIDECTOMY, PARTIAL  1994  . TONSILLECTOMY      Current Outpatient Medications:  .  acetaminophen (TYLENOL) 160 MG/5ML solution, Take 650 mg by mouth as needed. , Disp: , Rfl:  .  Calcium Carbonate (CALCIUM 600 PO), Take by mouth., Disp: , Rfl:  .  cholecalciferol (VITAMIN D)  1000 UNITS tablet, Take 1,000 Units by mouth daily., Disp: , Rfl:  .  Ibuprofen (ADVIL PO), Take by mouth., Disp: , Rfl:  .  levothyroxine (SYNTHROID, LEVOTHROID) 112 MCG tablet, Take 112 mcg by mouth daily., Disp: , Rfl:  .  Multiple Vitamin (MULTIVITAMIN) tablet, Take 1 tablet by mouth daily., Disp: , Rfl:  .  temazepam (RESTORIL) 15 MG capsule, as needed., Disp: , Rfl:   Allergies  Allergen Reactions  . Codeine     Hyperactive,overly awake  . Penicillins   . Sulfa Antibiotics    Review of Systems Objective:  There were no vitals filed for this visit.  General: Well developed, nourished, in no acute distress, alert and oriented x3   Dermatological: Skin is warm, dry and supple bilateral. Nails x 10 are well maintained; remaining integument appears unremarkable at this time. There are no open sores, no preulcerative lesions, no rash or signs of infection present.  Reactive hyperkeratosis inferior plantar rim of the heel no signs of wart.  Vascular: Dorsalis Pedis artery and Posterior Tibial artery pedal pulses are 2/4 bilateral with immedate capillary fill time. Pedal hair growth present. No varicosities and no lower extremity edema present bilateral.   Neruologic: Grossly intact via light touch bilateral. Vibratory intact via tuning fork bilateral. Protective threshold with Semmes Wienstein monofilament intact to all pedal sites bilateral. Patellar and  Achilles deep tendon reflexes 2+ bilateral. No Babinski or clonus noted bilateral.   Musculoskeletal: No gross boney pedal deformities bilateral. No pain, crepitus, or limitation noted with foot and ankle range of motion bilateral. Muscular strength 5/5 in all groups tested bilateral.  Gait: Unassisted, Nonantalgic.    Radiographs:  None taken  Assessment & Plan:   Assessment: Reactive hyperkeratosis posterior plantar heel left.  Plan: Debrided reactive hyperkeratosis.     Max T. Williamson, [place]

## 2018-10-11 ENCOUNTER — Telehealth: Payer: Self-pay | Admitting: Obstetrics and Gynecology

## 2018-10-11 NOTE — Telephone Encounter (Signed)
Spoke with patient. Patient reports painful bump just between vagina and rectum. Noticed one wk ago. Denies any other symptoms.   Patient has a hx of perianal abscess, states same location, lump is not as large. Patient will be traveling on 11/7, requesting OV with Dr. Quincy Simmonds. Patient lives 45 minutes away.   Advised will review schedule with Dr. Quincy Simmonds and return call, patient is agreeable.

## 2018-10-11 NOTE — Telephone Encounter (Signed)
Patient has a small cyst in vaginal area.

## 2018-10-11 NOTE — Telephone Encounter (Signed)
Reviewed with Dr. Quincy Simmonds, call returned to patient. OV scheduled for 11/5 at [place] to provider for final review. Patient is agreeable to disposition. Will close encounter.

## 2018-10-12 ENCOUNTER — Encounter: Payer: Self-pay | Admitting: Obstetrics and Gynecology

## 2018-10-12 ENCOUNTER — Ambulatory Visit: Payer: Medicare Other | Admitting: Obstetrics and Gynecology

## 2018-10-12 VITALS — BP 130/70 | HR 64 | Resp 14 | Wt 148.0 lb

## 2018-10-12 DIAGNOSIS — L723 Sebaceous cyst: Secondary | ICD-10-CM | POA: Diagnosis not present

## 2018-10-12 DIAGNOSIS — L0291 Cutaneous abscess, unspecified: Secondary | ICD-10-CM

## 2018-10-12 MED ORDER — DOXYCYCLINE HYCLATE 100 MG PO CAPS: 100.0000 mg | ORAL_CAPSULE | Freq: Two times a day (BID) | ORAL | 0 refills | Status: DC

## 2018-10-12 NOTE — Patient Instructions (Addendum)
Epidermal Cyst An epidermal cyst is sometimes called an epidermal inclusion cyst or an infundibular cyst. It is a sac made of skin tissue. The sac contains a substance called keratin. Keratin is a protein that is normally secreted through the hair follicles. When keratin becomes trapped in the top layer of skin (epidermis), it can form an epidermal cyst. Epidermal cysts are usually found on the face, neck, trunk, and genitals. These cysts are usually harmless (benign), and they may not cause symptoms unless they become infected. It is important not to pop epidermal cysts yourself. What are the causes? This condition may be caused by:  A blocked hair follicle.  A hair that curls and re-enters the skin instead of growing straight out of the skin (ingrown hair).  A blocked pore.  Irritated skin.  An injury to the skin.  Certain conditions that are passed along from parent to child (inherited).  Human papillomavirus (HPV).  What increases the risk? The following factors may make you more likely to develop an epidermal cyst:  Having acne.  Being overweight.  Wearing tight clothing.  What are the signs or symptoms? The only symptom of this condition may be a small, painless lump underneath the skin. When an epidermal cyst becomes infected, symptoms may include:  Redness.  Inflammation.  Tenderness.  Warmth.  Fever.  Keratin draining from the cyst. Keratin may look like a grayish-white, bad-smelling substance.  Pus draining from the cyst.  How is this diagnosed? This condition is diagnosed with a physical exam. In some cases, you may have a sample of tissue (biopsy) taken from your cyst to be examined under a microscope or tested for bacteria. You may be referred to a health care provider who specializes in skin care (dermatologist). How is this treated? In many cases, epidermal cysts go away on their own without treatment. If a cyst becomes infected, treatment may  include:  Opening and draining the cyst. After draining, minor surgery to remove the rest of the cyst may be done.  Antibiotic medicine to help prevent infection.  Injections of medicines (steroids) that help to reduce inflammation.  Surgery to remove the cyst. Surgery may be done if: ? The cyst becomes large. ? The cyst bothers you. ? There is a chance that the cyst could turn into cancer.  Follow these instructions at home:  Take over-the-counter and prescription medicines only as told by your health care provider.  If you were prescribed an antibiotic, use it as told by your health care provider. Do not stop using the antibiotic even if you start to feel better.  Keep the area around your cyst clean and dry.  Wear loose, dry clothing.  Do not try to pop your cyst.  Avoid touching your cyst.  Check your cyst every day for signs of infection.  Keep all follow-up visits as told by your health care provider. This is important. How is this prevented?  Wear clean, dry, clothing.  Avoid wearing tight clothing.  Keep your skin clean and dry. Shower or take baths every day.  Wash your body with a benzoyl peroxide wash when you shower or bathe. Contact a health care provider if:  Your cyst develops symptoms of infection.  Your condition is not improving or is getting worse.  You develop a cyst that looks different from other cysts you have had.  You have a fever. Get help right away if:  Redness spreads from the cyst into the surrounding area. This information is   not intended to replace advice given to you by your health care provider. Make sure you discuss any questions you have with your health care provider. Document Released: 10/25/2004 Document Revised: 07/23/2016 Document Reviewed: 09/26/2015 Elsevier Interactive Patient Education  2018 [employer]. Skin Abscess A skin abscess is an infected area on or under your skin that contains a collection of pus and other  material. An abscess may also be called a furuncle, carbuncle, or boil. An abscess can occur in or on almost any part of your body. Some abscesses break open (rupture) on their own. Most continue to get worse unless they are treated. The infection can spread deeper into the body and eventually into your blood, which can make you feel ill. Treatment usually involves draining the abscess. What are the causes? An abscess occurs when germs, often bacteria, pass through your skin and cause an infection. This may be caused by:  A scrape or cut on your skin.  A puncture wound through your skin, including a needle injection.  Blocked oil or sweat glands.  Blocked and infected hair follicles.  A cyst that forms beneath your skin (sebaceous cyst) and becomes infected.  What increases the risk? This condition is more likely to develop in people who:  Have a weak body defense system (immune system).  Have diabetes.  Have dry and irritated skin.  Get frequent injections or use illegal IV drugs.  Have a foreign body in a wound, such as a splinter.  Have problems with their lymph system or veins.  What are the signs or symptoms? An abscess may start as a painful, firm bump under the skin. Over time, the abscess may get larger or become softer. Pus may appear at the top of the abscess, causing pressure and pain. It may eventually break through the skin and drain. Other symptoms include:  Redness.  Warmth.  Swelling.  Tenderness.  A sore on the skin.  How is this diagnosed? This condition is diagnosed based on your medical history and a physical exam. A sample of pus may be taken from the abscess to find out what is causing the infection and what antibiotics can be used to treat it. You also may have:  Blood tests to look for signs of infection or spread of an infection to your blood.  Imaging studies such as ultrasound, CT scan, or MRI if the abscess is deep.  How is this  treated? Small abscesses that drain on their own may not need treatment. Treatment for an abscess that does not rupture on its own may include:  Warm compresses applied to the area several times per day.  Incision and drainage. Your health care provider will make an incision to open the abscess and will remove pus and any foreign body or dead tissue. The incision area may be packed with gauze to keep it open for a few days while it heals.  Antibiotic medicines to treat infection. For a severe abscess, you may first get antibiotics through an IV and then change to oral antibiotics.  Follow these instructions at home: Abscess Care  If you have an abscess that has not drained, place a warm, clean, wet washcloth over the abscess several times a day. Do this as told by your health care provider.  Follow instructions from your health care provider about how to take care of your abscess. Make sure you: ? Cover the abscess with a bandage (dressing). ? Change your dressing or gauze as told by  your health care provider. ? Wash your hands with soap and water before you change the dressing or gauze. If soap and water are not available, use hand sanitizer.  Check your abscess every day for signs of a worsening infection. Check for: ? More redness, swelling, or pain. ? More fluid or blood. ? Warmth. ? More pus or a bad smell. Medicines  Take over-the-counter and prescription medicines only as told by your health care provider.  If you were prescribed an antibiotic medicine, take it as told by your health care provider. Do not stop taking the antibiotic even if you start to feel better. General instructions  To avoid spreading the infection: ? Do not share personal care items, towels, or hot tubs with others. ? Avoid making skin contact with other people.  Keep all follow-up visits as told by your health care provider. This is important. Contact a health care provider if:  You have more  redness, swelling, or pain around your abscess.  You have more fluid or blood coming from your abscess.  Your abscess feels warm to the touch.  You have more pus or a bad smell coming from your abscess.  You have a fever.  You have muscle aches.  You have chills or a general ill feeling. Get help right away if:  You have severe pain.  You see red streaks on your skin spreading away from the abscess. This information is not intended to replace advice given to you by your health care provider. Make sure you discuss any questions you have with your health care provider. Document Released: 09/03/2005 Document Revised: 07/20/2016 Document Reviewed: 10/03/2015 Elsevier Interactive Patient Education  [employer].

## 2018-10-12 NOTE — Progress Notes (Signed)
GYNECOLOGY  VISIT   HPI: 77 y.o.   Married  Caucasian  female   G1P1001 with No LMP recorded. Patient is postmenopausal.   here for possible   Perianal abscess.  Lump between the vagina and the rectum for one week.  Used some hemorrhoidal cream.  Cannot tell if it is enlarging. Not draining.  Feels better now.  No fevers or malaise.  Hx or prior rectal abscess and thrombosed hemorrhoid.  Going to [employer].   GYNECOLOGIC HISTORY: No LMP recorded. Patient is postmenopausal. Contraception:  Postmenopausal  Menopausal hormone therapy:  none Last mammogram:  01/09/17 density c/ Neg/ Bi-rads 1 [facility]  Last pap smear: 08/01/08         OB History    Gravida  1   Para  1   Term  1   Preterm      AB      Living  1     SAB      TAB      Ectopic      Multiple      Live Births                 Patient Active Problem List   Diagnosis Date Noted  . Achalasia 09/07/2014  . Odynophagia 02/15/2014  . Regurgitation 02/15/2014  . Unspecified hypothyroidism 09/20/2013  . 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 Chest pain radiating to arm 05/25/2012    Past Medical History:  Diagnosis Date  . Colon polyps   . Complication of anesthesia    ALWAYS NAUESA & VOMITING  . 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 Hematuria    negative uro eval X 2  . Hepatitis 1976  . Hyperlipidemia   . Nephrolithiasis   . Osteopenia    dexa 2008  . Thyroid disease     Past Surgical History:  Procedure Laterality Date  . ABDOMINAL HYSTERECTOMY  1996   BSO secondary to AUB and fibroids  . BOTOX INJECTION N/A 03/31/2014   Procedure: BOTOX INJECTION;  Surgeon: Jeryl Columbia, MD;  Location: [facility];  Service: Endoscopy;  Laterality: N/A;  . ESOPHAGEAL MANOMETRY N/A 03/27/2014   Procedure: ESOPHAGEAL MANOMETRY (EM);  Surgeon: Jeryl Columbia, MD;  Location: [facility];  Service: Endoscopy;  Laterality: N/A;  . ESOPHAGOGASTRODUODENOSCOPY N/A 03/31/2014   Procedure:  ESOPHAGOGASTRODUODENOSCOPY (EGD);  Surgeon: Jeryl Columbia, MD;  Location: [facility];  Service: Endoscopy;  Laterality: N/A;  . ESOPHAGOGASTRODUODENOSCOPY ENDOSCOPY  summer 2013   Dr. Watt Climes  . HELLER MYOTOMY  09/07/14  . MOHS SURGERY  09/15/13   nose  . THYROIDECTOMY, PARTIAL  1994  . TONSILLECTOMY      Current Outpatient Medications  Medication Sig Dispense Refill  . acetaminophen (TYLENOL) 160 MG/5ML solution Take 650 mg by mouth as needed.     . Calcium Carbonate (CALCIUM 600 PO) Take by mouth.    . cholecalciferol (VITAMIN D) 1000 UNITS tablet Take 1,000 Units by mouth daily.    Marland Kitchen doxycycline (VIBRAMYCIN) 100 MG capsule Take 1 capsule (100 mg total) by mouth 2 (two) times daily. Take for 7 days. Take with food as can cause GI distress. 14 capsule 0  . Ibuprofen (ADVIL PO) Take by mouth.    . levothyroxine (SYNTHROID, LEVOTHROID) 112 MCG tablet Take 112 mcg by mouth daily.    . Multiple Vitamin (MULTIVITAMIN) tablet Take 1 tablet by mouth daily.    . temazepam (RESTORIL) 15 MG capsule as needed.  No current facility-administered medications for this visit.      ALLERGIES: Codeine; Penicillins; and Sulfa antibiotics  Family History  Problem Relation Age of Onset  . Hip fracture Mother   . Dementia Mother   . Coronary artery disease Father 3       CABG  . Coronary artery disease Paternal Aunt     Social History   Socioeconomic History  . Marital status: Married    Spouse name: Not on file  . Number of children: 1  . Years of education: Not on file  . Highest education level: Not on file  Occupational History  . Occupation: Retired Ecologist  . Financial resource strain: Not on file  . Food insecurity:    Worry: Not on file    Inability: Not on file  . Transportation needs:    Medical: Not on file    Non-medical: Not on file  Tobacco Use  . Smoking status: Never Smoker  . Smokeless tobacco: Never Used  Substance and Sexual Activity  . Alcohol  use: Yes    Comment: occasional wine with dinner  . Drug use: No  . Sexual activity: Not on file  Lifestyle  . Physical activity:    Days per week: Not on file    Minutes per session: Not on file  . Stress: Not on file  Relationships  . Social connections:    Talks on phone: Not on file    Gets together: Not on file    Attends religious service: Not on file    Active member of club or organization: Not on file    Attends meetings of clubs or organizations: Not on file    Relationship status: Not on file  . Intimate partner violence:    Fear of current or ex partner: Not on file    Emotionally abused: Not on file    Physically abused: Not on file    Forced sexual activity: Not on file  Other Topics Concern  . Not on file  Social History Narrative  . Not on file    Review of Systems  Genitourinary:       Vulvar/ lumps    PHYSICAL EXAMINATION:    BP 130/70   Pulse 64   Resp 14   Wt 148 lb (67.1 kg)   BMI 25.40 kg/m     General appearance: alert, cooperative and appears stated age   Pelvic: External genitalia:  no lesions              Urethra:  normal appearing urethra with no masses, tenderness or lesions                      Anus:   Left perianal 9 mm abscess.   Procedure - I and D abscess.  Consent for procedure.  Sterile prep with Hibiclens.  Local 1% lidocaine, lot [id number], exp 1/23.  Scalpel used to open skin.  Small amount of pus and sebaceous material noted.  Wound culture performed.  Bandaid placed.  Minimal EBL.  No complications.   Chaperone was present for exam.  ASSESSMENT  Infected sebaceous cyst.   PLAN  Discussed sebaceous cysts.  Wound culture.  Warm soaks with antibacterial soap.  Doxycycline 100 mg po bid x 7 days.  Call if not improved.  An After Visit Summary was printed and given to the patient.  ___15____ minutes face to face time of which over 50% was spent in  counseling.

## 2018-10-15 LAB — WOUND CULTURE: Organism ID, Bacteria: NONE SEEN

## 2018-11-27 ENCOUNTER — Encounter

## 2019-02-07 ENCOUNTER — Encounter: Payer: Self-pay | Admitting: Obstetrics and Gynecology

## 2019-02-07 ENCOUNTER — Other Ambulatory Visit: Payer: Self-pay

## 2019-02-07 ENCOUNTER — Ambulatory Visit (INDEPENDENT_AMBULATORY_CARE_PROVIDER_SITE_OTHER): Payer: Medicare Other | Admitting: Obstetrics and Gynecology

## 2019-02-07 VITALS — BP 138/76 | HR 70 | Resp 16 | Ht 64.0 in | Wt 147.4 lb

## 2019-02-07 DIAGNOSIS — Z01419 Encounter for gynecological examination (general) (routine) without abnormal findings: Secondary | ICD-10-CM

## 2019-02-07 NOTE — Patient Instructions (Signed)

## 2019-02-07 NOTE — Progress Notes (Signed)
78 y.o. G70P1001 Married Caucasian female here for annual exam.    Up twice a night to void.  Drinks a lot of water.   States Dr. Forde Dandy though her thyroid was enlarged.   Health issues in her family.   PCP:  Reynold Bowen, MD   No LMP recorded. Patient is postmenopausal.           Sexually active: No.  The current method of family planning is post menopausal status.    Exercising: Yes.    works out at [employer], cardio and walking Smoker:  no  Health Maintenance: Pap:  08/01/08 negative.   Hysterectomy. History of abnormal Pap:  no MMG:  01/2018 normal per patient--[facility].  She will schedule. Colonoscopy: 05/01/17 polyps- [facility].  Does not need another due to age  BMD:   2018  Result :Osteoporosis--Dr.South.  Scheduled for 02/23/19 in PCP office.  TDaP:  09-12-11 Gardasil:   no HIV: Declines.  Hep C: NA.  Screening Labs:  PCP.    reports that she has never smoked. She has never used smokeless tobacco. She reports current alcohol use. She reports that she does not use drugs.  Past Medical History:  Diagnosis Date  . Colon polyps   . Complication of anesthesia    ALWAYS NAUESA & VOMITING  . 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 Hematuria    negative uro eval X 2  . Hepatitis 1976  . Hyperlipidemia   . Nephrolithiasis   . Osteopenia    dexa 2008  . Thyroid disease     Past Surgical History:  Procedure Laterality Date  . ABDOMINAL HYSTERECTOMY  1996   BSO secondary to AUB and fibroids  . BOTOX INJECTION N/A 03/31/2014   Procedure: BOTOX INJECTION;  Surgeon: Jeryl Columbia, MD;  Location: [facility];  Service: Endoscopy;  Laterality: N/A;  . ESOPHAGEAL MANOMETRY N/A 03/27/2014   Procedure: ESOPHAGEAL MANOMETRY (EM);  Surgeon: Jeryl Columbia, MD;  [facility];  Service: Endoscopy;  Laterality: N/A;  . ESOPHAGOGASTRODUODENOSCOPY N/A 03/31/2014   Procedure: ESOPHAGOGASTRODUODENOSCOPY (EGD);  Surgeon: Jeryl Columbia, MD;  Location: [facility];  Service:  Endoscopy;  Laterality: N/A;  . ESOPHAGOGASTRODUODENOSCOPY ENDOSCOPY  summer 2013   Dr. Watt Climes  . HELLER MYOTOMY  09/07/14  . MOHS SURGERY  09/15/13   nose  . THYROIDECTOMY, PARTIAL  1994  . TONSILLECTOMY      Current Outpatient Medications  Medication Sig Dispense Refill  . acetaminophen (TYLENOL) 160 MG/5ML solution Take 650 mg by mouth as needed.     . Calcium Carbonate (CALCIUM 600 PO) Take by mouth.    . cholecalciferol (VITAMIN D) 1000 UNITS tablet Take 1,000 Units by mouth daily.    Marland Kitchen doxycycline (VIBRAMYCIN) 100 MG capsule Take 1 capsule (100 mg total) by mouth 2 (two) times daily. Take for 7 days. Take with food as can cause GI distress. 14 capsule 0  . Ibuprofen (ADVIL PO) Take by mouth.    . levothyroxine (SYNTHROID, LEVOTHROID) 112 MCG tablet Take 112 mcg by mouth daily.    . Multiple Vitamin (MULTIVITAMIN) tablet Take 1 tablet by mouth daily.    . Multiple Vitamins-Minerals (PRESERVISION AREDS PO) Take 1 tablet by mouth 2 (two) times daily.    . temazepam (RESTORIL) 15 MG capsule as needed.     No current facility-administered medications for this visit.     Family History  Problem Relation Age of Onset  . Hip fracture Mother   .  Dementia Mother   . Coronary artery disease Father 31       CABG  . Coronary artery disease Paternal Aunt     Review of Systems  All other systems reviewed and are negative.   Exam:   BP 138/76 (BP Location: Right Arm, Patient Position: Sitting, Cuff Size: Normal)   Pulse 70   Resp 16   Ht 5\' 4"  (1.626 m)   Wt 147 lb 6.4 oz (66.9 kg)   BMI 25.30 kg/m     General appearance: alert, cooperative and appears stated age Head: Normocephalic, without obvious abnormality, atraumatic Neck: no adenopathy, supple, symmetrical, trachea midline and thyroid normal to inspection and palpation Lungs: clear to auscultation bilaterally Breasts: normal appearance, no masses or tenderness, No nipple retraction or dimpling, No nipple discharge or  bleeding, No axillary or supraclavicular adenopathy Heart: regular rate and rhythm Abdomen: soft, non-tender; no masses, no organomegaly Extremities: extremities normal, atraumatic, no cyanosis or edema Skin: Skin color, texture, turgor normal. No rashes or lesions Lymph nodes: Cervical, supraclavicular, and axillary nodes normal. No abnormal inguinal nodes palpated Neurologic: Grossly normal  Pelvic: External genitalia:  no lesions              Urethra:  normal appearing urethra with no masses, tenderness or lesions              Bartholins and Skenes: normal                 Vagina: normal appearing vagina with normal color and discharge, no lesions              Cervix: absent              Pap taken: No. Bimanual Exam:  Uterus:  absent              Adnexa: no mass, fullness, tenderness              Rectal exam: Yes.  .  Confirms.              Anus:  normal sphincter tone, no lesions  Chaperone was present for exam.  Assessment:   Well woman visit with normal exam. Status post TVH/BSO.  Prior ERT use.  Hx thrombosed hemorrhoid. Osteoporosis.  Untreated. Status post partial thyroidectomy.   Plan: Mammogram screening. Recommended self breast awareness. Pap and HR HPV as above. Guidelines for Calcium, Vitamin D, regular exercise program including cardiovascular and weight bearing exercise. She will have  BMD this spring.  Follow up annually and prn.   After visit summary provided.

## 2019-04-26 ENCOUNTER — Encounter: Payer: Self-pay | Admitting: Obstetrics and Gynecology

## 2019-05-11 ENCOUNTER — Encounter: Payer: Self-pay | Admitting: Podiatry

## 2019-05-11 ENCOUNTER — Ambulatory Visit: Payer: Medicare Other | Admitting: Podiatry

## 2019-05-11 ENCOUNTER — Other Ambulatory Visit: Payer: Self-pay

## 2019-05-11 VITALS — Temp 96.6°F

## 2019-05-11 DIAGNOSIS — Q828 Other specified congenital malformations of skin: Secondary | ICD-10-CM

## 2019-05-11 NOTE — Progress Notes (Signed)
She presents today chief complaint of a painful callus to the plantar lateral aspect of the fifth metatarsal head times the past 6 weeks or so.  She states that it felt like something was cutting into her foot is been just bothersome and hurts when she wears shoes.  She is been soaking Epsom salts with no relief.  Objective: Pulses are palpable.  Neurologic sensorium is intact deep tendon reflexes are intact muscle strength is normal.  She has pain on palpation of the small porokeratotic lesion plantar aspect of fifth metatarsal head of the left foot.  I did nucleated this today.  Assessment: Porokeratosis left.  Plan: I debrided reactive hyperkeratotic lesion today follow-up with her as needed.

## 2020-02-13 NOTE — Progress Notes (Signed)
79 y.o. G56P1001 Married Caucasian female here for annual exam.    Has completed her Covid vaccine.   Going to the [employer].   Labs with PCP.   PCP:  Reynold Bowen, MD   No LMP recorded. Patient is postmenopausal.            Sexually active: No.  The current method of family planning is post menopausal status/Hysterectomy.    Exercising: No.  The patient does not participate in regular exercise at present. Some Walkiing Smoker:  no  Health Maintenance: Pap: 08-01-08 Neg.  Hysterectomy History of abnormal Pap:  no MMG: 2020 normal with [facility]--called for report Colonoscopy: 05-01-17 polyps--[facility]. No further testing due to age. BMD:   2018  Result : Osteoporosis--Dr.South TDaP:  09-12-11 Gardasil:   no HIV: Declines Hep C: no Screening Labs:  PCP.    reports that she has never smoked. She has never used smokeless tobacco. She reports current alcohol use. She reports that she does not use drugs.  Past Medical History:  Diagnosis Date  . Colon polyps   . Complication of anesthesia    ALWAYS NAUESA & VOMITING  . 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 Hematuria    negative uro eval X 2  . Hepatitis 1976  . Hyperlipidemia   . Nephrolithiasis   . Osteopenia    dexa 2008  . Thyroid disease     Past Surgical History:  Procedure Laterality Date  . ABDOMINAL HYSTERECTOMY  1996   BSO secondary to AUB and fibroids  . BOTOX INJECTION N/A 03/31/2014   Procedure: BOTOX INJECTION;  Surgeon: Jeryl Columbia, MD;  [facility];  Service: Endoscopy;  Laterality: N/A;  . ESOPHAGEAL MANOMETRY N/A 03/27/2014   Procedure: ESOPHAGEAL MANOMETRY (EM);  Surgeon: Jeryl Columbia, MD;  [facility];  Service: Endoscopy;  Laterality: N/A;  . ESOPHAGOGASTRODUODENOSCOPY N/A 03/31/2014   Procedure: ESOPHAGOGASTRODUODENOSCOPY (EGD);  Surgeon: Jeryl Columbia, MD;  Location: [facility];  Service: Endoscopy;  Laterality: N/A;  . ESOPHAGOGASTRODUODENOSCOPY ENDOSCOPY  summer  2013   Dr. Watt Climes  . HELLER MYOTOMY  09/07/14  . MOHS SURGERY  09/15/13   nose  . THYROIDECTOMY, PARTIAL  1994  . TONSILLECTOMY      Current Outpatient Medications  Medication Sig Dispense Refill  . acetaminophen (TYLENOL) 160 MG/5ML solution Take 650 mg by mouth as needed.     . Calcium Carbonate (CALCIUM 600 PO) Take by mouth.    . Ibuprofen (ADVIL PO) Take by mouth.    . levothyroxine (SYNTHROID, LEVOTHROID) 112 MCG tablet Take 112 mcg by mouth daily.    . Multiple Vitamins-Minerals (PRESERVISION AREDS PO) Take 1 tablet by mouth 2 (two) times daily.    Marland Kitchen omeprazole (PRILOSEC) 20 MG capsule Take 20 mg by mouth daily.    . rosuvastatin (CRESTOR) 5 MG tablet     . temazepam (RESTORIL) 15 MG capsule as needed.     No current facility-administered medications for this visit.    Family History  Problem Relation Age of Onset  . Hip fracture Mother   . Dementia Mother   . Coronary artery disease Father 87       CABG  . Coronary artery disease Paternal Aunt     Review of Systems  All other systems reviewed and are negative.   Exam:   BP 140/70   Pulse 70   Temp (!) 96.9 F (36.1 C) (Temporal)   Resp 14   Ht  5' 3.5" (1.613 m)   Wt 146 lb 3.2 oz (66.3 kg)   BMI 25.49 kg/m     General appearance: alert, cooperative and appears stated age Head: normocephalic, without obvious abnormality, atraumatic Neck: no adenopathy, supple, symmetrical, trachea midline and thyroid normal to inspection and palpation Lungs: clear to auscultation bilaterally Breasts: normal appearance, no masses or tenderness, No nipple retraction or dimpling, No nipple discharge or bleeding, No axillary adenopathy Heart: regular rate and rhythm Abdomen: soft, non-tender; no masses, no organomegaly Extremities: extremities normal, atraumatic, no cyanosis or edema Skin: skin color, texture, turgor normal. No rashes or lesions Lymph nodes: cervical, supraclavicular, and axillary nodes normal. Neurologic:  grossly normal  Pelvic: External genitalia:  no lesions              No abnormal inguinal nodes palpated.              Urethra:  normal appearing urethra with no masses, tenderness or lesions              Bartholins and Skenes: normal                 Vagina: normal appearing vagina with normal color and discharge, no lesions              Cervix: absent              Pap taken: No. Bimanual Exam:  Uterus: absent              Adnexa: no mass, fullness, tenderness              Rectal exam: Yes.  .  Confirms.              Anus:  normal sphincter tone, no lesions  Chaperone was present for exam.  Assessment:   Well woman visit with normal exam. Status post TVH/BSO.  Prior ERT use.  Hx thrombosed hemorrhoid. Osteoporosis. Untreated. Status post partial thyroidectomy.   Plan: Mammogram screening discussed. Self breast awareness reviewed. Pap and HR HPV as above. Guidelines for Calcium, Vitamin D, regular exercise program including cardiovascular and weight bearing exercise. Will get BMD report from PCP. Follow up annually and prn.   After visit summary provided.

## 2020-02-14 ENCOUNTER — Other Ambulatory Visit: Payer: Self-pay

## 2020-02-15 ENCOUNTER — Ambulatory Visit (INDEPENDENT_AMBULATORY_CARE_PROVIDER_SITE_OTHER): Payer: Medicare PPO | Admitting: Obstetrics and Gynecology

## 2020-02-15 ENCOUNTER — Encounter: Payer: Self-pay | Admitting: Obstetrics and Gynecology

## 2020-02-15 VITALS — BP 140/70 | HR 70 | Temp 96.9°F | Resp 14 | Ht 63.5 in | Wt 146.2 lb

## 2020-02-15 DIAGNOSIS — Z01419 Encounter for gynecological examination (general) (routine) without abnormal findings: Secondary | ICD-10-CM

## 2020-02-15 NOTE — Patient Instructions (Signed)

## 2020-03-19 ENCOUNTER — Ambulatory Visit: Payer: Medicare PPO | Admitting: Cardiovascular Disease

## 2020-03-23 ENCOUNTER — Encounter (INDEPENDENT_AMBULATORY_CARE_PROVIDER_SITE_OTHER): Payer: Self-pay

## 2020-03-23 ENCOUNTER — Encounter: Payer: Self-pay | Admitting: Cardiovascular Disease

## 2020-03-23 ENCOUNTER — Ambulatory Visit: Payer: Medicare PPO | Admitting: Cardiovascular Disease

## 2020-03-23 ENCOUNTER — Other Ambulatory Visit: Payer: Self-pay

## 2020-03-23 VITALS — BP 150/80 | HR 63 | Ht 63.5 in | Wt 146.8 lb

## 2020-03-23 DIAGNOSIS — R079 Chest pain, unspecified: Secondary | ICD-10-CM | POA: Diagnosis not present

## 2020-03-23 DIAGNOSIS — R072 Precordial pain: Secondary | ICD-10-CM | POA: Diagnosis not present

## 2020-03-23 MED ORDER — METOPROLOL TARTRATE 50 MG PO TABS: ORAL_TABLET | ORAL | 0 refills | Status: DC

## 2020-03-23 NOTE — Progress Notes (Signed)
Chief Complaint  Patient presents with  . New Patient (Initial Visit)   History of Present Illness:78 yo female with history of hyperlipidemia, thyroid disease and kidney stones who is here today as a new patient to reestablish cardiac care. I saw her in 2013 but she has not been seen in our office since then. She has been intolerant of statins in the past. In January 2013, she was stressed and felt her heart pounding. She felt that she was having a panic attack. There was no chest pain or SOB at that time. There was some shoulder pain but no chest pain. Echo in 2013 showed LVEF=55-60%. No valve disease. She has achalasia and had a Heller's myotomy.   She tells me today that she has been having pain in her upper chest and into the neck. This happens after meals and mostly at rest. There is associated nausea at times but no dyspnea or dizziness/diaphoresis. Occasional exertional pain. No lower ext edema, orthopnea or PND.   Primary Care Physician: Reynold Bowen, MD  Past Medical History:  Diagnosis Date  . Colon polyps   . Complication of anesthesia    ALWAYS NAUESA & VOMITING  . 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 Hematuria    negative uro eval X 2  . Hepatitis 1976  . Hyperlipidemia   . Nephrolithiasis   . Osteopenia    dexa 2008  . Thyroid disease     Past Surgical History:  Procedure Laterality Date  . ABDOMINAL HYSTERECTOMY  1996   BSO secondary to AUB and fibroids  . BOTOX INJECTION N/A 03/31/2014   Procedure: BOTOX INJECTION;  Surgeon: Jeryl Columbia, MD;  [facility];  Service: Endoscopy;  Laterality: N/A;  . ESOPHAGEAL MANOMETRY N/A 03/27/2014   Procedure: ESOPHAGEAL MANOMETRY (EM);  Surgeon: Jeryl Columbia, MD;  [facility];  Service: Endoscopy;  Laterality: N/A;  . ESOPHAGOGASTRODUODENOSCOPY N/A 03/31/2014   Procedure: ESOPHAGOGASTRODUODENOSCOPY (EGD);  Surgeon: Jeryl Columbia, MD;  Location: [facility];  Service: Endoscopy;   Laterality: N/A;  . ESOPHAGOGASTRODUODENOSCOPY ENDOSCOPY  summer 2013   Dr. Watt Climes  . HELLER MYOTOMY  09/07/14  . MOHS SURGERY  09/15/13   nose  . THYROIDECTOMY, PARTIAL  1994  . TONSILLECTOMY      Current Outpatient Medications  Medication Sig Dispense Refill  . acetaminophen (TYLENOL) 160 MG/5ML solution Take 650 mg by mouth as needed.     . Calcium Carbonate (CALCIUM 600 PO) Take by mouth.    . Ibuprofen (ADVIL PO) Take by mouth.    . levothyroxine (SYNTHROID, LEVOTHROID) 112 MCG tablet Take 112 mcg by mouth daily.    . Multiple Vitamins-Minerals (PRESERVISION AREDS PO) Take 1 tablet by mouth 2 (two) times daily.    Marland Kitchen omeprazole (PRILOSEC) 20 MG capsule Take 20 mg by mouth daily.    . rosuvastatin (CRESTOR) 5 MG tablet     . temazepam (RESTORIL) 15 MG capsule as needed.    . metoprolol tartrate (LOPRESSOR) 50 MG tablet Take 1 tablet by mouth two hours prior to ct 1 tablet 0   No current facility-administered medications for this visit.    Allergies  Allergen Reactions  . Codeine     Hyperactive,overly awake  . Penicillins   . Sulfa Antibiotics     Social History   Socioeconomic History  . Marital status: Married    Spouse name: Not on file  . Number of children: 1  . Years of  education: Not on file  . Highest education level: Not on file  Occupational History  . Occupation: Retired Tourist information centre manager  Tobacco Use  . Smoking status: Never Smoker  . Smokeless tobacco: Never Used  Substance and Sexual Activity  . Alcohol use: Yes    Comment: occasional wine with dinner  . Drug use: No  . Sexual activity: Not Currently  Other Topics Concern  . Not on file  Social History Narrative  . Not on file   Social Determinants of Health   Financial Resource Strain:   . Difficulty of Paying Living Expenses:   Food Insecurity:   . Worried About Charity fundraiser in the Last Year:   . Arboriculturist in the Last Year:   Transportation Needs:   . Film/video editor  (Medical):   Marland Kitchen Lack of Transportation (Non-Medical):   Physical Activity:   . Days of Exercise per Week:   . Minutes of Exercise per Session:   Stress:   . Feeling of Stress :   Social Connections:   . Frequency of Communication with Friends and Family:   . Frequency of Social Gatherings with Friends and Family:   . Attends Religious Services:   . Active Member of Clubs or Organizations:   . Attends Archivist Meetings:   Marland Kitchen Marital Status:   Intimate Partner Violence:   . Fear of Current or Ex-Partner:   . Emotionally Abused:   Marland Kitchen Physically Abused:   . Sexually Abused:     Family History  Problem Relation Age of Onset  . Hip fracture Mother   . Dementia Mother   . Coronary artery disease Father 57       CABG  . Coronary artery disease Paternal Aunt     Review of Systems:  As stated in the HPI and otherwise negative.   BP (!) 150/80   Pulse 63   Ht 5' 3.5" (1.613 m)   Wt 146 lb 12.8 oz (66.6 kg)   SpO2 99%   BMI 25.60 kg/m   Physical Examination: General: Well developed, well nourished, NAD  HEENT: OP clear, mucus membranes moist  SKIN: warm, dry. No rashes. Neuro: No focal deficits  Musculoskeletal: Muscle strength 5/5 all ext  Psychiatric: Mood and affect normal  Neck: No JVD, no carotid bruits, no thyromegaly, no lymphadenopathy.  Lungs:Clear bilaterally, no wheezes, rhonci, crackles Cardiovascular: Regular rate and rhythm. No murmurs, gallops or rubs. Abdomen:Soft. Bowel sounds present. Non-tender.  Extremities: No lower extremity edema. Pulses are 2 + in the bilateral DP/PT.  EKG:  EKG is ordered today. The ekg ordered today demonstrates NSR, rate 63 bpm  Recent Labs: No results found for requested labs within last 8760 hours.   Lipid Panel No results found for: CHOL, TRIG, HDL, CHOLHDL, VLDL, LDLCALC, LDLDIRECT   Wt Readings from Last 3 Encounters:  03/23/20 146 lb 12.8 oz (66.6 kg)  02/15/20 146 lb 3.2 oz (66.3 kg)  02/07/19 147 lb 6.4  oz (66.9 kg)      Assessment and Plan:   1. Chest pain: Her pain sounds atypical for cardiac pain but given her age and FH of CAD, will arrange a gated coronary CTA to exclude CAD. BMET today.  Will arrange an echo as well to assess LV systolic function.   Current medicines are reviewed at length with the patient today.  The patient does not have concerns regarding medicines.  The following changes have been made:  no change  Labs/ tests ordered today include:   Orders Placed This Encounter  Procedures  . CT CORONARY MORPH W/CTA COR W/SCORE W/CA W/CM &/OR WO/CM  . CT CORONARY FRACTIONAL FLOW RESERVE DATA PREP  . CT CORONARY FRACTIONAL FLOW RESERVE FLUID ANALYSIS  . Basic metabolic panel  . EKG 12-Lead  . ECHOCARDIOGRAM COMPLETE     Disposition:   FU with me in 3 months.    Signed, Lauree Chandler, MD 03/23/2020 12:09 PM    [facility] [address], [place], [place]  [address] Phone: [phone]; Fax: [phone]

## 2020-03-23 NOTE — Patient Instructions (Addendum)
Medication Instructions:  No changes today *If you need a refill on your cardiac medications before your next appointment, please call your pharmacy*   Lab Work: BMET today  Testing/Procedures: Your physician has requested that you have an echocardiogram. Echocardiography is a painless test that uses sound waves to create images of your heart. It provides your doctor with information about the size and shape of your heart and how well your heart's chambers and valves are working. This procedure takes approximately one hour. There are no restrictions for this procedure.  Your physician has requested that you have cardiac CT. Cardiac computed tomography (CT) is a painless test that uses an x-ray machine to take clear, detailed pictures of your heart. For further information please visit [email or web address].[email or web address]. Please follow instruction sheet as given.   Follow-Up: At [facility], you and your health needs are our priority.  As part of our continuing mission to provide you with exceptional heart care, we have created designated Provider Care Teams.  These Care Teams include your primary Cardiologist (physician) and Advanced Practice Providers (APPs -  Physician Assistants and Nurse Practitioners) who all work together to provide you with the care you need, when you need it.  We recommend signing up for the patient portal called "MyChart".  Sign up information is provided on this After Visit Summary.  MyChart is used to connect with patients for Virtual Visits (Telemedicine).  Patients are able to view lab/test results, encounter notes, upcoming appointments, etc.  Non-urgent messages can be sent to your provider as well.   To learn more about what you can do with MyChart, go to [email or web address].    Your next appointment:   3 month(s)  The format for your next appointment:   In Person  Provider:   Lauree Chandler, MD   Other Instructions  Your cardiac CT will be scheduled at  one of the below locations:   [facility] [address] [place], [place] [address] [phone]  [place] [address] [place], [place] [address] [phone]  If scheduled at [facility], please arrive at the [facility] main entrance of [facility] 30 minutes prior to test start time. Proceed to the [facility] Radiology Department (first floor) to check-in and test prep.  If scheduled at [facility], please arrive 15 mins early for check-in and test prep.  Please follow these instructions carefully (unless otherwise directed):  On the Night Before the Test: . Be sure to Drink plenty of water. . Do not consume any caffeinated/decaffeinated beverages or chocolate 12 hours prior to your test. . Do not take any antihistamines 12 hours prior to your test.  If you take Metformin do not take 24 hours prior to test.  On the Day of the Test: . Drink plenty of water. Do not drink any water within one hour of the test. . Do not eat any food 4 hours prior to the test. . You may take your regular medications prior to the test.  . Take metoprolol (Lopressor) two hours prior to test. . FEMALES- please wear underwire-free bra if available       After the Test: . Drink plenty of water. . After receiving IV contrast, you may experience a mild flushed feeling. This is normal. . On occasion, you may experience a mild rash up to 24 hours after the test. This is not dangerous. If this occurs, you can take Benadryl  25 mg and increase your fluid intake. . If you experience trouble breathing, this can be serious. If it is severe call 911 IMMEDIATELY. If it is mild, please call our office. . If you take any of these medications: Glipizide/Metformin, Avandament, Glucavance, please do not take 48 hours after completing test unless otherwise instructed.   Once we have confirmed authorization  from your insurance company, we will call you to set up a date and time for your test.   For non-scheduling related questions, please contact the cardiac imaging nurse navigator should you have any questions/concerns: Marchia Bond, RN Navigator Cardiac Imaging [facility] [phone] office  For scheduling needs, including cancellations and rescheduling, please call [phone].

## 2020-03-24 LAB — BASIC METABOLIC PANEL
BUN/Creatinine Ratio: 19 (ref 12–28)
BUN: 15 mg/dL (ref 8–27)
CO2: 21 mmol/L (ref 20–29)
Calcium: 9.5 mg/dL (ref 8.7–10.3)
Chloride: 105 mmol/L (ref 96–106)
Creatinine, Ser: 0.79 mg/dL (ref 0.57–1.00)
GFR calc Af Amer: 83 mL/min/{1.73_m2} (ref >59)
GFR calc non Af Amer: 72 mL/min/{1.73_m2} (ref >59)
Glucose: 105 mg/dL — ABNORMAL HIGH (ref 65–99)
Potassium: 4.2 mmol/L (ref 3.5–5.2)
Sodium: 142 mmol/L (ref 134–144)

## 2020-04-10 ENCOUNTER — Other Ambulatory Visit: Payer: Self-pay

## 2020-04-10 ENCOUNTER — Ambulatory Visit (HOSPITAL_COMMUNITY): Payer: Medicare PPO | Attending: Cardiology

## 2020-04-10 DIAGNOSIS — R079 Chest pain, unspecified: Secondary | ICD-10-CM | POA: Diagnosis not present

## 2020-04-10 DIAGNOSIS — R072 Precordial pain: Secondary | ICD-10-CM | POA: Diagnosis not present

## 2020-04-12 ENCOUNTER — Ambulatory Visit: Payer: Medicare PPO | Admitting: Cardiovascular Disease

## 2020-04-20 ENCOUNTER — Telehealth (HOSPITAL_COMMUNITY): Payer: Self-pay | Admitting: *Deleted

## 2020-04-20 NOTE — Telephone Encounter (Signed)
Attempted to call patient regarding upcoming cardiac CT appointment. Left message on voicemail with name and callback number  Jamine Highfill Tai RN Navigator Cardiac Imaging [facility] [phone] Office [phone] Cell 

## 2020-04-20 NOTE — Telephone Encounter (Signed)
Pt returned call concerning cardiac CT instructions.  Pt expressed understanding of instructions.  Pt states that she will have a panic attack if her IV is in her [facility] but is fine if IV is a little below the [facility] or above the [facility].  Pt understanding that if we are unable to obtain an IV where we need it, the scan will have to be canceled.

## 2020-04-23 ENCOUNTER — Encounter (HOSPITAL_COMMUNITY): Payer: Self-pay

## 2020-04-23 ENCOUNTER — Other Ambulatory Visit: Payer: Self-pay

## 2020-04-23 ENCOUNTER — Ambulatory Visit (HOSPITAL_COMMUNITY)
Admission: RE | Admit: 2020-04-23 | Discharge: 2020-04-23 | Disposition: A | Discharge status: Home / Self Care | Payer: Medicare PPO | Source: Ambulatory Visit | Attending: Cardiovascular Disease | Admitting: Cardiovascular Disease

## 2020-04-23 DIAGNOSIS — R072 Precordial pain: Secondary | ICD-10-CM | POA: Insufficient documentation

## 2020-04-23 MED ORDER — IOHEXOL 350 MG/ML SOLN
80.0000 mL | Freq: Once | INTRAVENOUS | Status: AC | PRN
  Administered 2020-04-23: 80 mL via INTRAVENOUS

## 2020-04-23 MED ORDER — NITROGLYCERIN 0.4 MG SL SUBL
SUBLINGUAL_TABLET | SUBLINGUAL | Status: AC
  Filled 2020-04-23: qty 2

## 2020-04-23 MED ORDER — NITROGLYCERIN 0.4 MG SL SUBL
0.8000 mg | SUBLINGUAL_TABLET | Freq: Once | SUBLINGUAL | Status: AC
  Administered 2020-04-23: 0.8 mg via SUBLINGUAL

## 2020-06-02 IMAGING — CT CT HEART MORP W/ CTA COR W/ SCORE W/ CA W/CM &/OR W/O CM
1 series · 8 of 10 positions shown, 10 images · non-contrast
Comparison: Chest CT 02/23/2014.
COMPARISON: Chest CT 02/23/2014.

Addendum:
EXAM:
OVER-READ INTERPRETATION  CT CHEST

The following report is an over-read performed by radiologist Dr.
Mamasaliu Ribera [[email or web address]] on 04/23/2020. This over-read
does not include interpretation of cardiac or coronary anatomy or
pathology. The coronary CTA interpretation by the cardiologist is
attached.
CLINICAL DATA: Chest pain
Cardiac/Coronary CTA
TECHNIQUE: The patient was scanned on a Phillips Force scanner. A 100 kV
prospective scan was triggered in the descending thoracic aorta at
111 HU's. Axial non-contrast 3 mm slices were carried out through
the heart. The data set was analyzed on a dedicated work station and
scored using the Agatson method. Gantry rotation speed was 250 msecs
and collimation was .6 mm. No beta blockade and 0.8 mg of sl NTG was
given. The 3D data set was reconstructed in 5% intervals of the
35-75 % of the R-R cycle. Diastolic phases were analyzed on a
dedicated work station using MPR, MIP and VRT modes. The patient
received 80 cc of contrast.

[Series 601: findings · 8 of 10 slices shown, 10 images]
[im 2/10  vessel]
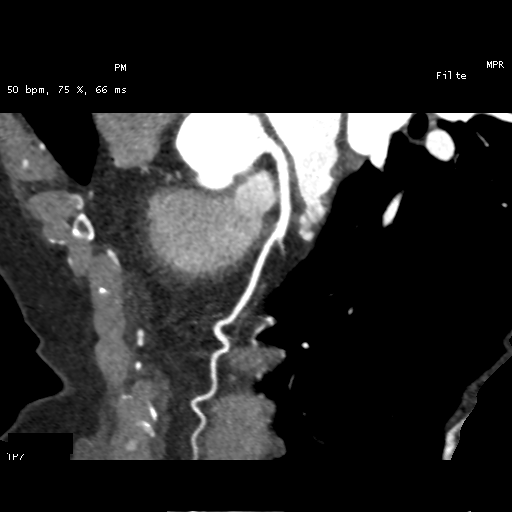
[im 2/10  lung]
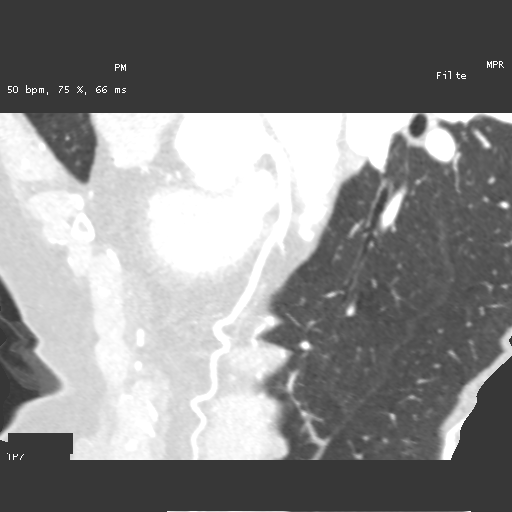
[im 3/10  vessel]
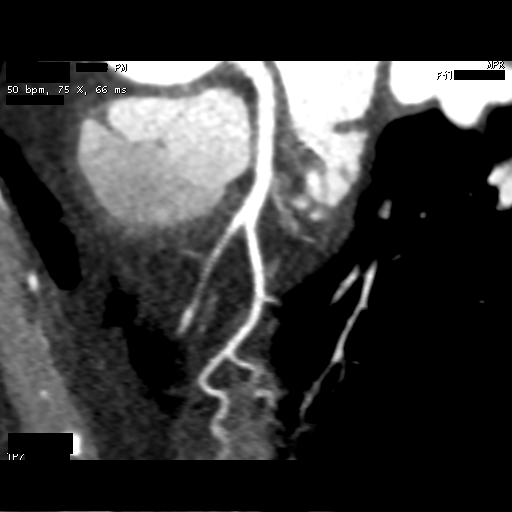
[im 4/10  vessel]
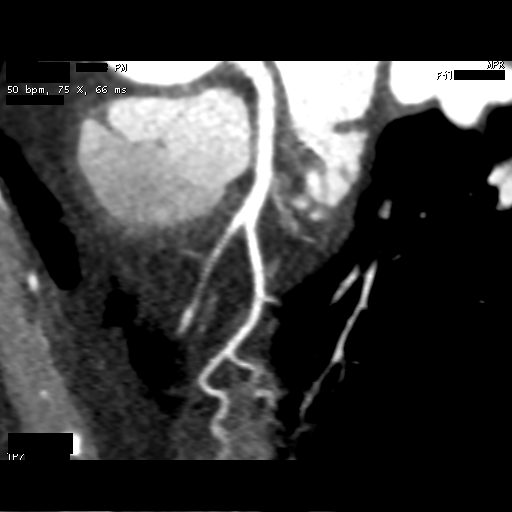
[im 5/10  vessel]
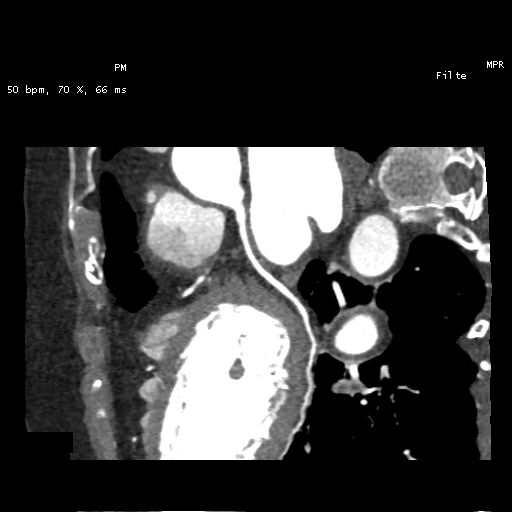
[im 6/10  vessel]
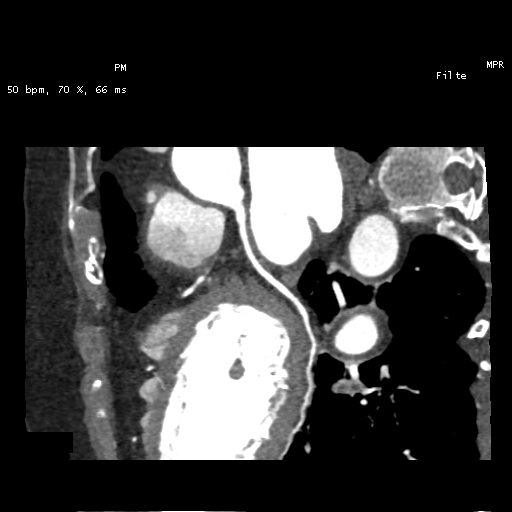
[im 6/10  lung]
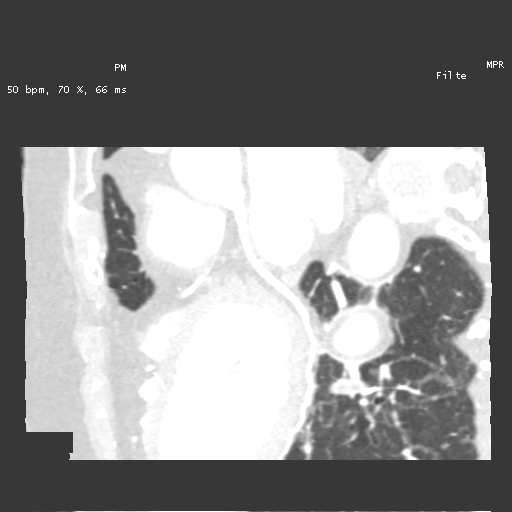
[im 7/10  vessel]
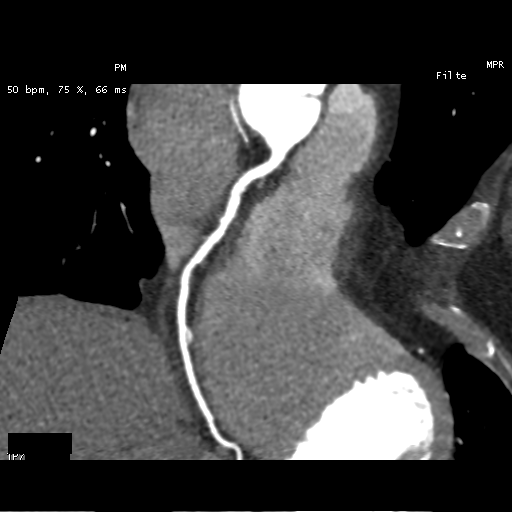
[im 8/10  vessel]
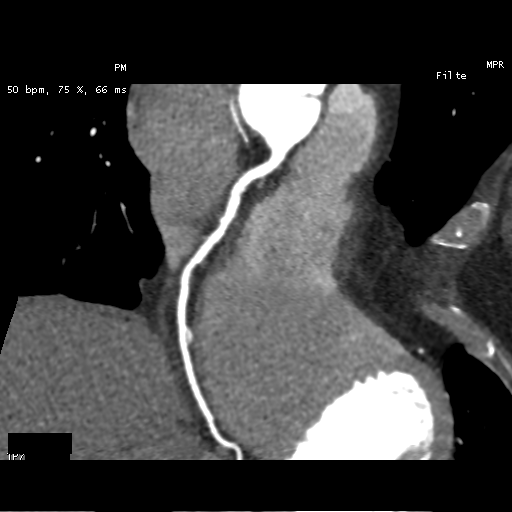
[im 9/10  vessel]
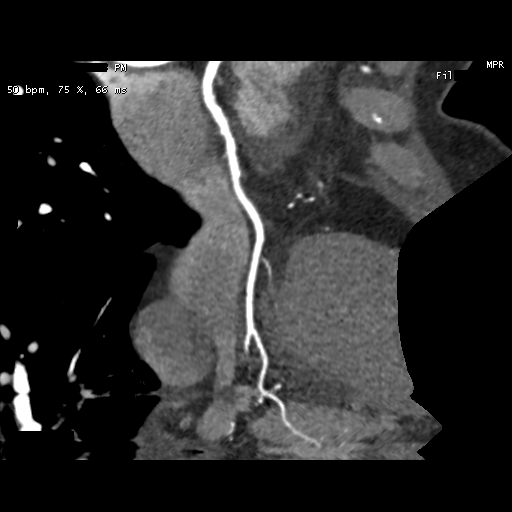

[8 of 10 positions shown; findings below may reference images not displayed]

FINDINGS: Vascular: Tortuous thoracic no central pulmonary embolism, on this
non-dedicated study.

Mediastinum/Nodes: No imaged thoracic adenopathy. Moderately dilated
esophagus with fluid within.

Lungs/Pleura: No pleural fluid.  Bibasilar scarring.

Upper Abdomen: Normal imaged portions of the liver, spleen.

Musculoskeletal: No acute osseous abnormality.
IMPRESSION: 1.  No acute findings in the imaged extracardiac chest.
2. Similar esophageal dilatation with fluid within. This suggests
dysmotility or gastroesophageal reflux.
FINDINGS: Image quality: excellent.

Noise artifact is: Limited.

Coronary Arteries:  Normal coronary origin.  Right dominance.

Left main: The left main is a large caliber vessel with a normal
take off from the left coronary cusp that bifurcates to form a left
anterior descending artery and a left circumflex artery. There is no
plaque or stenosis.

Left anterior descending artery: The LAD is patent without evidence
of plaque or stenosis. The LAD gives off 1 patent diagonal branch.

Left circumflex artery: The LCX is non-dominant. The proximal LCX
contains minimal calcified plaque (<25%). The LCX gives off 2 patent
obtuse marginal branches.

Right coronary artery: The RCA is dominant with normal take off from
the right coronary cusp. The proximal RCA contains minimal calcified
plaque (<25%). The mid RCA contains minimal mixed-density plaque
(<25%). The RCA terminates as a PDA and right posterolateral branch
without evidence of plaque or stenosis.

Right Atrium: Right atrial size is within normal limits.

Right Ventricle: The right ventricular cavity is within normal
limits.

Left Atrium: Left atrial size is normal in size with no left atrial
appendage filling defect.

Left Ventricle: The ventricular cavity size is within normal limits.
There are no stigmata of prior infarction. There is no abnormal
filling defect.

Pulmonary arteries: Normal in size without proximal filling defect.

Pulmonary veins: Normal pulmonary venous drainage.

Pericardium: Normal thickness with no significant effusion or
calcium present.

Cardiac valves: The aortic valve is trileaflet without significant
calcification. The mitral valve is normal structure without
significant calcification.

Aorta: Normal caliber with no significant disease.

Extra-cardiac findings: See attached radiology report for
non-cardiac structures.
IMPRESSION: 1. Coronary calcium score of 32. This was 36th percentile for age
and sex matched controls.

2. Normal coronary origin with right dominance.

3. Minimal CAD in the LCX/RCA (<25%).

RECOMMENDATIONS:
1. Minimal non-obstructive CAD (0-24%). Consider non-atherosclerotic
causes of chest pain. Consider preventive therapy and risk factor
modification.

*** End of Addendum ***
EXAM:
OVER-READ INTERPRETATION  CT CHEST

The following report is an over-read performed by radiologist Dr.
Mamasaliu Ribera [REDACTED] on 04/23/2020. This over-read
does not include interpretation of cardiac or coronary anatomy or
pathology. The coronary CTA interpretation by the cardiologist is
attached.
FINDINGS: Vascular: Tortuous thoracic no central pulmonary embolism, on this
non-dedicated study.

Mediastinum/Nodes: No imaged thoracic adenopathy. Moderately dilated
esophagus with fluid within.

Lungs/Pleura: No pleural fluid.  Bibasilar scarring.

Upper Abdomen: Normal imaged portions of the liver, spleen.

Musculoskeletal: No acute osseous abnormality.
IMPRESSION: 1.  No acute findings in the imaged extracardiac chest.
2. Similar esophageal dilatation with fluid within. This suggests
dysmotility or gastroesophageal reflux.

## 2020-08-24 ENCOUNTER — Ambulatory Visit: Payer: Medicare PPO | Admitting: Cardiovascular Disease

## 2020-09-05 ENCOUNTER — Ambulatory Visit (INDEPENDENT_AMBULATORY_CARE_PROVIDER_SITE_OTHER): Payer: Medicare PPO

## 2020-09-05 ENCOUNTER — Other Ambulatory Visit: Payer: Self-pay

## 2020-09-05 ENCOUNTER — Encounter: Payer: Self-pay | Admitting: Podiatry

## 2020-09-05 ENCOUNTER — Other Ambulatory Visit: Payer: Self-pay | Admitting: Podiatry

## 2020-09-05 ENCOUNTER — Ambulatory Visit: Payer: Medicare PPO | Admitting: Podiatry

## 2020-09-05 DIAGNOSIS — M7662 Achilles tendinitis, left leg: Secondary | ICD-10-CM

## 2020-09-05 DIAGNOSIS — M722 Plantar fascial fibromatosis: Secondary | ICD-10-CM

## 2020-09-05 MED ORDER — GABAPENTIN 100 MG PO CAPS
100.0000 mg | ORAL_CAPSULE | Freq: Every day | ORAL | 3 refills | Status: DC
Start: 2020-09-05 — End: 2021-06-03

## 2020-09-05 NOTE — Progress Notes (Signed)
She presents today states that I have a knot on my left heel she is some kind of cyst or something has been there for the past 2 to 3 weeks states that it really does not hurt but sometimes I get a pulling sensation in the back of that leg.  She states that she gets burning in both of her feet particularly at nighttime.  Objective: Vital signs are stable she is alert oriented x3 there is no erythema edema/drainage odor she has a fat cell herniation in the posterior aspect of her heel which is nontender on palpation.  She really does not have any reproducible pain.  Neurologically speaking her nerves are intact per Thornell Mule monofilament.  Deep tendon reflexes are intact muscle strength is normal symmetrical.  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  Plan: Started her on 100 mg of gabapentin just at nighttime.  Offered her an injection for the fat but she declined.  I will follow-up with her in a few weeks.

## 2020-10-10 ENCOUNTER — Encounter: Payer: Self-pay | Admitting: Podiatry

## 2020-10-10 ENCOUNTER — Ambulatory Visit: Payer: Medicare PPO | Admitting: Podiatry

## 2020-10-10 DIAGNOSIS — G609 Hereditary and idiopathic neuropathy, unspecified: Secondary | ICD-10-CM | POA: Diagnosis not present

## 2020-10-10 NOTE — Progress Notes (Signed)
She presents today for follow-up of her neuropathy bilateral states that they feel better than they did she has some burning that has not resolved she says but it is can take the medicine is scared me to badly I was afraid of that possible side effects.  Objective: Did not take her shoes off today she said with her shoes on in the chair.  We discussed in great detail today the use of her medication that she has purchased versus not using it versus using an another medicine that is similar versus topical anti-inflammatories.  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  Plan: At this point we recommended topical Voltaren to try first and if this did not work I encouraged her to try at least a week's worth of the gabapentin 100 mg 1 by mouth at nighttime only.  She will notify me with any questions or concerns or if she started using the medication.

## 2020-12-20 ENCOUNTER — Ambulatory Visit: Payer: Medicare PPO | Admitting: Cardiovascular Disease

## 2021-05-09 ENCOUNTER — Other Ambulatory Visit: Payer: Self-pay

## 2021-05-09 ENCOUNTER — Ambulatory Visit (INDEPENDENT_AMBULATORY_CARE_PROVIDER_SITE_OTHER): Payer: Medicare PPO | Admitting: Obstetrics and Gynecology

## 2021-05-09 ENCOUNTER — Encounter: Payer: Self-pay | Admitting: Obstetrics and Gynecology

## 2021-05-09 VITALS — BP 128/78 | HR 80 | Ht 64.0 in | Wt 146.0 lb

## 2021-05-09 DIAGNOSIS — Z1239 Encounter for other screening for malignant neoplasm of breast: Secondary | ICD-10-CM | POA: Diagnosis not present

## 2021-05-09 DIAGNOSIS — G629 Polyneuropathy, unspecified: Secondary | ICD-10-CM

## 2021-05-09 DIAGNOSIS — Z01419 Encounter for gynecological examination (general) (routine) without abnormal findings: Secondary | ICD-10-CM | POA: Diagnosis not present

## 2021-05-09 DIAGNOSIS — Z008 Encounter for other general examination: Secondary | ICD-10-CM

## 2021-05-09 NOTE — Progress Notes (Signed)
80 y.o. G75P1001 Married Caucasian female here for breast and pelvic exam.   May have neuropathy of her feet.  Did not take Neurontin that was prescribed.  Misses walking.  She is wanting to know who to see for further care.   Has varicose veins.  Has done sclerotherapy.   PCP: Reynold Bowen, MD    No LMP recorded. Patient is postmenopausal.           Sexually active: No.  The current method of family planning is post menopausal status/Hyst.    Exercising: No.  The patient does not participate in regular exercise at present. Smoker:  no  Health Maintenance: Pap: 08-01-08 Neg History of abnormal Pap:  no MMG: 05-02-20 3D/Neg/Birads1 -- Appt. today Colonoscopy: 05-01-17 polyps--[facility]. No further testing due to age.  BMD: 02-23-19   Result :Osteoporosis--PCP follows TDaP: 09-12-11 --PCP Gardasil:   no HIV: Declines Hep C:no Screening Labs:  PCP   reports that she has never smoked. She has never used smokeless tobacco. She reports current alcohol use. She reports that she does not use drugs.  Past Medical History:  Diagnosis Date  . Colon polyps   . Complication of anesthesia    ALWAYS NAUESA & VOMITING  . 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 Hematuria    negative uro eval X 2  . Hepatitis 1976  . Hyperlipidemia   . Nephrolithiasis   . Osteopenia    dexa 2008  . Thyroid disease     Past Surgical History:  Procedure Laterality Date  . ABDOMINAL HYSTERECTOMY  1996   BSO secondary to AUB and fibroids  . BOTOX INJECTION N/A 03/31/2014   Procedure: BOTOX INJECTION;  Surgeon: Jeryl Columbia, MD;  Location: [facility];  Service: Endoscopy;  Laterality: N/A;  . ESOPHAGEAL MANOMETRY N/A 03/27/2014   Procedure: ESOPHAGEAL MANOMETRY (EM);  Surgeon: Jeryl Columbia, MD;  Location: [facility];  Service: Endoscopy;  Laterality: N/A;  . ESOPHAGOGASTRODUODENOSCOPY N/A 03/31/2014   Procedure: ESOPHAGOGASTRODUODENOSCOPY (EGD);  Surgeon: Jeryl Columbia, MD;  Location: [facility];  Service: Endoscopy;  Laterality: N/A;  . ESOPHAGOGASTRODUODENOSCOPY ENDOSCOPY  summer 2013   Dr. Watt Climes  . HELLER MYOTOMY  09/07/14  . MOHS SURGERY  09/15/13   nose  . THYROIDECTOMY, PARTIAL  1994  . TONSILLECTOMY      Current Outpatient Medications  Medication Sig Dispense Refill  . acetaminophen (TYLENOL) 160 MG/5ML solution Take 650 mg by mouth as needed.     . Calcium Carbonate (CALCIUM 600 PO) Take by mouth.    . gabapentin (NEURONTIN) 100 MG capsule Take 1 capsule (100 mg total) by mouth at bedtime. 90 capsule 3  . Ibuprofen (ADVIL PO) Take by mouth.    . levothyroxine (SYNTHROID, LEVOTHROID) 112 MCG tablet Take 112 mcg by mouth daily.    . Multiple Vitamins-Minerals (PRESERVISION AREDS PO) Take 1 tablet by mouth 2 (two) times daily.    Marland Kitchen omeprazole (PRILOSEC) 20 MG capsule Take 20 mg by mouth daily.    . temazepam (RESTORIL) 15 MG capsule as needed.     No current facility-administered medications for this visit.    Family History  Problem Relation Age of Onset  . Hip fracture Mother   . Dementia Mother   . Coronary artery disease Father 31       CABG  . Coronary artery disease Paternal Aunt     Review of Systems  All other systems reviewed and are negative.   Exam:  BP 128/78   Pulse 80   Ht 5\' 4"  (1.626 m)   Wt 146 lb (66.2 kg)   SpO2 98%   BMI 25.06 kg/m     General appearance: alert, cooperative and appears stated age Head: normocephalic, without obvious abnormality, atraumatic Lungs: clear to auscultation bilaterally Breasts: normal appearance, no masses or tenderness, No nipple retraction or dimpling, No nipple discharge or bleeding, No axillary adenopathy Heart: regular rate and rhythm Abdomen: soft, non-tender; no masses, no organomegaly Extremities: extremities normal, atraumatic, no cyanosis or edema Skin: skin color, texture, turgor normal. No rashes or lesions Lymph nodes: cervical, supraclavicular, and axillary nodes  normal.  Pelvic: External genitalia:  no lesions              No abnormal inguinal nodes palpated.              Urethra:  normal appearing urethra with no masses, tenderness or lesions              Bartholins and Skenes: normal                 Vagina: normal appearing vagina with normal color and discharge, no lesions              Cervix: absent              Pap taken: No. Bimanual Exam:  Uterus:  absent              Adnexa: no mass, fullness, tenderness              Rectal exam: Yes.  .  Confirms.              Anus:  normal sphincter tone, no lesions  Chaperone was present for exam.  Assessment:   Screening breast exam.  Pelvic exam without abnormal findings.  Encounter for rectal exam.  Status post TVH/BSO.  Prior ERT use.  Hx thrombosed hemorrhoid. Osteoporosis. Untreated. Nephrolithiasis.  Status post partial thyroidectomy. Neuropathy.  Uncertain etiology.   Plan: Mammogram screening discussed. Self breast awareness reviewed. Pap and HR HPV as above. Guidelines for Calcium, Vitamin D, regular exercise program including cardiovascular and weight bearing exercise. Osteoporosis treatment through PCP.  BMD may be due.  I recommend she follow up with her PCP regarding her neuropathy to determine next steps in care.  She may benefit from seeing neurology as a secondary consultation.  Follow up in 2 years for routine visit and prn.   32 min  total time was spent for this patient encounter, including preparation, face-to-face counseling with the patient, coordination of care, and documentation of the encounter.

## 2021-05-13 NOTE — Patient Instructions (Signed)

## 2021-05-16 ENCOUNTER — Encounter: Payer: Self-pay | Admitting: Obstetrics and Gynecology

## 2021-06-03 ENCOUNTER — Encounter: Payer: Self-pay | Admitting: Cardiovascular Disease

## 2021-06-03 ENCOUNTER — Other Ambulatory Visit: Payer: Self-pay

## 2021-06-03 ENCOUNTER — Ambulatory Visit: Payer: Medicare PPO | Admitting: Cardiovascular Disease

## 2021-06-03 VITALS — BP 124/78 | HR 60 | Ht 64.0 in | Wt 146.8 lb

## 2021-06-03 DIAGNOSIS — R079 Chest pain, unspecified: Secondary | ICD-10-CM

## 2021-06-03 NOTE — Progress Notes (Signed)
Chief Complaint  Patient presents with   Follow-up    CAD    History of Present Illness:79 yo female with history of mild CAD, hyperlipidemia, thyroid disease and kidney stones who is here today for cardiac follow up. I saw her as a new patient in 2013 but then she was lost to follow up until April 2021. She has been intolerant of statins in the past. In January 2013, she was stressed and felt her heart pounding. She felt that she was having a panic attack. There was no chest pain or SOB at that time. There was some shoulder pain but no chest pain. Echo in 2013 showed LVEF=55-60%. No valve disease. She has achalasia and had a Heller's myotomy. When I saw her in 2021 she c/o pain in her upper chest and into the neck. This happens after meals and mostly at rest. There is associated nausea at times but no dyspnea or dizziness/diaphoresis. Echo 04/10/20 with JKDT=26-71%, grade 2 diastolic dysfunction. No aortic valve or mitral valve disease. Cardiac CTA in May 2021 with minimal plaque in the Circumflex and RCA.   She is here today for follow up. The patient denies any chest pain, dyspnea, palpitations, lower extremity edema, orthopnea, PND, dizziness, near syncope or syncope.   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   Hematuria    negative uro eval X 2   Hepatitis 1976   Hyperlipidemia    Nephrolithiasis    Osteopenia    dexa 2008   Thyroid disease     Past Surgical History:  Procedure Laterality Date   ABDOMINAL HYSTERECTOMY  1996   BSO secondary to AUB and fibroids   BOTOX INJECTION N/A 03/31/2014   Procedure: BOTOX INJECTION;  Surgeon: Jeryl Columbia, MD;  Location: [facility];  Service: Endoscopy;  Laterality: N/A;   ESOPHAGEAL MANOMETRY N/A 03/27/2014   Procedure: ESOPHAGEAL MANOMETRY (EM);  Surgeon: Jeryl Columbia,  MD;  Location: [facility];  Service: Endoscopy;  Laterality: N/A;   ESOPHAGOGASTRODUODENOSCOPY N/A 03/31/2014   Procedure: ESOPHAGOGASTRODUODENOSCOPY (EGD);  Surgeon: Jeryl Columbia, MD;  Location: [facility];  Service: Endoscopy;  Laterality: N/A;   ESOPHAGOGASTRODUODENOSCOPY ENDOSCOPY  summer 2013   Dr. Watt Climes   HELLER MYOTOMY  09/07/14   MOHS SURGERY  09/15/13   nose   THYROIDECTOMY, PARTIAL  1994   TONSILLECTOMY      Current Outpatient Medications  Medication Sig Dispense Refill   acetaminophen (TYLENOL) 160 MG/5ML solution Take 650 mg by mouth as needed.      Calcium Carbonate (CALCIUM 600 PO) Take by mouth.     Ibuprofen (ADVIL PO) Take 1 tablet by mouth as needed.     levothyroxine (SYNTHROID, LEVOTHROID) 112 MCG tablet Take 112 mcg by mouth daily.     Multiple Vitamins-Minerals (PRESERVISION AREDS PO) Take 1 tablet by mouth 2 (two) times daily.     omeprazole (PRILOSEC) 20 MG capsule Take 20 mg by mouth daily.     temazepam (RESTORIL) 15 MG capsule as needed.     No current facility-administered medications for this visit.    Allergies  Allergen Reactions   Codeine     Hyperactive,overly awake   Levofloxacin Other (See Comments)   Penicillins    Sulfa Antibiotics     Social History  Socioeconomic History   Marital status: Married    Spouse name: Not on file   Number of children: 1   Years of education: Not on file   Highest education level: Not on file  Occupational History   Occupation: Retired Tourist information centre manager  Tobacco Use   Smoking status: Never   Smokeless tobacco: Never  Vaping Use   Vaping Use: Never used  Substance and Sexual Activity   Alcohol use: Yes    Comment: occasional wine with dinner   Drug use: No   Sexual activity: Not Currently  Other Topics Concern   Not on file  Social History Narrative   Not on file   Social Determinants of Health   Financial Resource Strain: Not on file  Food Insecurity: Not on file  Transportation Needs: Not on  file  Physical Activity: Not on file  Stress: Not on file  Social Connections: Not on file  Intimate Partner Violence: Not on file    Family History  Problem Relation Age of Onset   Hip fracture Mother    Dementia Mother    Coronary artery disease Father 76       CABG   Coronary artery disease Paternal Aunt     Review of Systems:  As stated in the HPI and otherwise negative.   BP 124/78   Pulse 60   Ht 5\' 4"  (1.626 m)   Wt 146 lb 12.8 oz (66.6 kg)   SpO2 99%   BMI 25.20 kg/m   Physical Examination:. General: Well developed, well nourished, NAD  HEENT: OP clear, mucus membranes moist  SKIN: warm, dry. No rashes. Neuro: No focal deficits  Musculoskeletal: Muscle strength 5/5 all ext  Psychiatric: Mood and affect normal  Neck: No JVD, no carotid bruits, no thyromegaly, no lymphadenopathy.  Lungs:Clear bilaterally, no wheezes, rhonci, crackles Cardiovascular: Regular rate and rhythm. No murmurs, gallops or rubs. Abdomen:Soft. Bowel sounds present. Non-tender.  Extremities: No lower extremity edema. Pulses are 2 + in the bilateral DP/PT.  EKG:  EKG is ordered today. The ekg ordered today demonstrates sinus  Echo 04/10/20:  1. Left ventricular ejection fraction, by estimation, is 60 to 65%. The  left ventricle has normal function. The left ventricle has no regional  wall motion abnormalities. Left ventricular diastolic parameters are  consistent with Grade II diastolic  dysfunction (pseudonormalization).   2. Right ventricular systolic function is normal. The right ventricular  size is normal. There is mildly elevated pulmonary artery systolic  pressure.   3. The mitral valve is normal in structure. No evidence of mitral valve  regurgitation. No evidence of mitral stenosis.   4. Tricuspid valve regurgitation is moderate.   5. The aortic valve is normal in structure. Aortic valve regurgitation is  not visualized. No aortic stenosis is present.   6. The inferior vena  cava is normal in size with greater than 50%  respiratory variability, suggesting right atrial pressure of 3 mmHg.   Recent Labs: No results found for requested labs within last 8760 hours.   Lipid Panel No results found for: CHOL, TRIG, HDL, CHOLHDL, VLDL, LDLCALC, LDLDIRECT   Wt Readings from Last 3 Encounters:  06/03/21 146 lb 12.8 oz (66.6 kg)  05/09/21 146 lb (66.2 kg)  03/23/20 146 lb 12.8 oz (66.6 kg)      Assessment and Plan:   1. Chest pain: Her pain is not felt to be cardiac. No recent pain. Coronary CTA in May 2021 with very mild CAD. Echo with  normal LV function and no aortic or mitral valve disease.   Current medicines are reviewed at length with the patient today.  The patient does not have concerns regarding medicines.  The following changes have been made:  no change  Labs/ tests ordered today include:   Orders Placed This Encounter  Procedures   EKG 12-Lead      Disposition:   F/U with me in 12 months.    Signed, Lauree Chandler, MD 06/03/2021 11:30 AM    [facility] [address], [place], [place]  [address] Phone: [phone]; Fax: [phone]

## 2021-06-03 NOTE — Patient Instructions (Signed)
Medication Instructions:  Your physician recommends that you continue on your current medications as directed. Please refer to the Current Medication list given to you today.  *If you need a refill on your cardiac medications before your next appointment, please call your pharmacy*   Lab Work: None If you have labs (blood work) drawn today and your tests are completely normal, you will receive your results only by: [place] (if you have MyChart) OR A paper copy in the mail If you have any lab test that is abnormal or we need to change your treatment, we will call you to review the results.   Testing/Procedures: None   Follow-Up: At [facility], you and your health needs are our priority.  As part of our continuing mission to provide you with exceptional heart care, we have created designated Provider Care Teams.  These Care Teams include your primary Cardiologist (physician) and Advanced Practice Providers (APPs -  Physician Assistants and Nurse Practitioners) who all work together to provide you with the care you need, when you need it.  We recommend signing up for the patient portal called "MyChart".  Sign up information is provided on this After Visit Summary.  MyChart is used to connect with patients for Virtual Visits (Telemedicine).  Patients are able to view lab/test results, encounter notes, upcoming appointments, etc.  Non-urgent messages can be sent to your provider as well.   To learn more about what you can do with MyChart, go to [email or web address].    Your next appointment:   1 year(s)  The format for your next appointment:   In Person  Provider:   You may see Lauree Chandler, MD or one of the following Advanced Practice Providers on your designated Care Team:   Melina Copa, PA-C Ermalinda Barrios, PA-C   Other Instructions

## 2021-09-09 ENCOUNTER — Encounter: Payer: Self-pay | Admitting: Ophthalmology

## 2021-09-23 NOTE — Discharge Instructions (Signed)

## 2021-09-25 ENCOUNTER — Ambulatory Visit: Payer: Medicare PPO | Admitting: Anesthesiology

## 2021-09-25 ENCOUNTER — Other Ambulatory Visit: Payer: Self-pay

## 2021-09-25 ENCOUNTER — Ambulatory Visit
Admission: RE | Admit: 2021-09-25 | Discharge: 2021-09-25 | Disposition: A | Discharge status: Home / Self Care | Payer: Medicare PPO | Attending: Ophthalmology | Admitting: Ophthalmology

## 2021-09-25 ENCOUNTER — Encounter
Admission: RE | Disposition: A | Discharge status: Home / Self Care | Payer: Self-pay | Source: Home / Self Care | Attending: Ophthalmology

## 2021-09-25 DIAGNOSIS — Z888 Allergy status to other drugs, medicaments and biological substances status: Secondary | ICD-10-CM | POA: Diagnosis not present

## 2021-09-25 DIAGNOSIS — Z79899 Other long term (current) drug therapy: Secondary | ICD-10-CM | POA: Diagnosis not present

## 2021-09-25 DIAGNOSIS — Z79890 Hormone replacement therapy (postmenopausal): Secondary | ICD-10-CM | POA: Diagnosis not present

## 2021-09-25 DIAGNOSIS — Z880 Allergy status to penicillin: Secondary | ICD-10-CM | POA: Diagnosis not present

## 2021-09-25 DIAGNOSIS — Z8249 Family history of ischemic heart disease and other diseases of the circulatory system: Secondary | ICD-10-CM | POA: Diagnosis not present

## 2021-09-25 DIAGNOSIS — H2512 Age-related nuclear cataract, left eye: Secondary | ICD-10-CM | POA: Insufficient documentation

## 2021-09-25 DIAGNOSIS — Z885 Allergy status to narcotic agent status: Secondary | ICD-10-CM | POA: Diagnosis not present

## 2021-09-25 DIAGNOSIS — Z882 Allergy status to sulfonamides status: Secondary | ICD-10-CM | POA: Insufficient documentation

## 2021-09-25 HISTORY — DX: Headache, unspecified: R51.9

## 2021-09-25 HISTORY — PX: CATARACT EXTRACTION W/PHACO: SHX586

## 2021-09-25 HISTORY — DX: Gastro-esophageal reflux disease without esophagitis: K21.9

## 2021-09-25 HISTORY — DX: Unspecified osteoarthritis, unspecified site: M19.90

## 2021-09-25 SURGERY — PHACOEMULSIFICATION, CATARACT, WITH IOL INSERTION
Anesthesia: Monitor Anesthesia Care | Site: Eye | Laterality: Left

## 2021-09-25 MED ORDER — ARMC OPHTHALMIC DILATING DROPS
1.0000 "application " | OPHTHALMIC | Status: DC | PRN
  Administered 2021-09-25 (×3): 1 via OPHTHALMIC

## 2021-09-25 MED ORDER — TETRACAINE HCL 0.5 % OP SOLN
1.0000 [drp] | OPHTHALMIC | Status: DC | PRN
  Administered 2021-09-25 (×3): 1 [drp] via OPHTHALMIC

## 2021-09-25 MED ORDER — MOXIFLOXACIN HCL 0.5 % OP SOLN
OPHTHALMIC | Status: DC | PRN
  Administered 2021-09-25: 0.2 mL via OPHTHALMIC

## 2021-09-25 MED ORDER — ONDANSETRON HCL 4 MG/2ML IJ SOLN
4.0000 mg | Freq: Once | INTRAMUSCULAR | Status: AC
  Administered 2021-09-25: 4 mg via INTRAVENOUS

## 2021-09-25 MED ORDER — SIGHTPATH DOSE#1 BSS IO SOLN
INTRAOCULAR | Status: DC | PRN
  Administered 2021-09-25: 15 mL via INTRAOCULAR

## 2021-09-25 MED ORDER — MIDAZOLAM HCL 2 MG/2ML IJ SOLN
INTRAMUSCULAR | Status: DC | PRN
  Administered 2021-09-25: 2 mg via INTRAVENOUS

## 2021-09-25 MED ORDER — LACTATED RINGERS IV SOLN: INTRAVENOUS | Status: DC

## 2021-09-25 MED ORDER — BRIMONIDINE TARTRATE-TIMOLOL 0.2-0.5 % OP SOLN
OPHTHALMIC | Status: DC | PRN
  Administered 2021-09-25: 1 [drp] via OPHTHALMIC

## 2021-09-25 MED ORDER — SIGHTPATH DOSE#1 BSS IO SOLN
INTRAOCULAR | Status: DC | PRN
  Administered 2021-09-25: 78 mL via OPHTHALMIC

## 2021-09-25 MED ORDER — SIGHTPATH DOSE#1 BSS IO SOLN
INTRAOCULAR | Status: DC | PRN
  Administered 2021-09-25: 2 mL

## 2021-09-25 MED ORDER — FENTANYL CITRATE (PF) 100 MCG/2ML IJ SOLN
INTRAMUSCULAR | Status: DC | PRN
  Administered 2021-09-25 (×2): 50 ug via INTRAVENOUS

## 2021-09-25 MED ORDER — SIGHTPATH DOSE#1 NA HYALUR & NA CHOND-NA HYALUR IO KIT
PACK | INTRAOCULAR | Status: DC | PRN
  Administered 2021-09-25: 1 via OPHTHALMIC

## 2021-09-25 MED ORDER — DEXMEDETOMIDINE (PRECEDEX) IN NS 20 MCG/5ML (4 MCG/ML) IV SYRINGE
PREFILLED_SYRINGE | INTRAVENOUS | Status: DC | PRN
  Administered 2021-09-25: 5 ug via INTRAVENOUS

## 2021-09-25 SURGICAL SUPPLY — 19 items
CANNULA ANT/CHMB 27G (MISCELLANEOUS) IMPLANT
CANNULA ANT/CHMB 27GA (MISCELLANEOUS) IMPLANT
GLOVE SRG 8 PF TXTR STRL LF DI (GLOVE) ×1 IMPLANT
GLOVE SURG ENC TEXT LTX SZ7.5 (GLOVE) ×2 IMPLANT
GLOVE SURG UNDER POLY LF SZ8 (GLOVE) ×2
GOWN STRL REUS W/ TWL LRG LVL3 (GOWN DISPOSABLE) ×2 IMPLANT
GOWN STRL REUS W/TWL LRG LVL3 (GOWN DISPOSABLE) ×4
LENS IOL TECNIS EYHANCE 21.5 (Intraocular Lens) ×1 IMPLANT
MARKER SKIN DUAL TIP RULER LAB (MISCELLANEOUS) ×2 IMPLANT
NDL CAPSULORHEX 25GA (NEEDLE) IMPLANT
NDL FILTER BLUNT 18X1 1/2 (NEEDLE) ×2 IMPLANT
NEEDLE CAPSULORHEX 25GA (NEEDLE) IMPLANT
NEEDLE FILTER BLUNT 18X 1/2SAF (NEEDLE) ×2
NEEDLE FILTER BLUNT 18X1 1/2 (NEEDLE) ×2 IMPLANT
PACK EYE AFTER SURG (MISCELLANEOUS) IMPLANT
SYR 3ML LL SCALE MARK (SYRINGE) ×4 IMPLANT
SYR TB 1ML LUER SLIP (SYRINGE) ×2 IMPLANT
WATER STERILE IRR 250ML POUR (IV SOLUTION) ×2 IMPLANT
WIPE NON LINTING 3.25X3.25 (MISCELLANEOUS) ×2 IMPLANT

## 2021-09-25 NOTE — Transfer of Care (Signed)
Immediate Anesthesia Transfer of Care Note  Patient: Stephanie Davila  Procedure(s) Performed: CATARACT EXTRACTION PHACO AND INTRAOCULAR LENS PLACEMENT (IOC) LEFT 5.14 00:59.9 (Left: Eye)  Patient Location: PACU  Anesthesia Type: MAC  Level of Consciousness: awake, alert  and patient cooperative  Airway and Oxygen Therapy: Patient Spontanous Breathing and Patient connected to supplemental oxygen  Post-op Assessment: Post-op Vital signs reviewed, Patient's Cardiovascular Status Stable, Respiratory Function Stable, Patent Airway and No signs of Nausea or vomiting  Post-op Vital Signs: Reviewed and stable  Complications: No notable events documented.

## 2021-09-25 NOTE — Anesthesia Preprocedure Evaluation (Signed)
Anesthesia Evaluation  Patient identified by MRN, date of birth, ID band Patient awake    Reviewed: Allergy & Precautions, NPO status , Patient's Chart, lab work & pertinent test results  Airway Mallampati: II  TM Distance: >3 FB Neck ROM: Full    Dental no notable dental hx.    Pulmonary neg pulmonary ROS,    Pulmonary exam normal        Cardiovascular negative cardio ROS Normal cardiovascular exam     Neuro/Psych  Headaches, negative psych ROS   GI/Hepatic Neg liver ROS, GERD  Medicated and Controlled,  Endo/Other  Hypothyroidism   Renal/GU Renal disease (Nephrolithiasis)     Musculoskeletal  (+) Arthritis ,   Abdominal Normal abdominal exam  (+)   Peds  Hematology   Anesthesia Other Findings   Reproductive/Obstetrics                             Anesthesia Physical Anesthesia Plan  ASA: 2  Anesthesia Plan: MAC   Post-op Pain Management:    Induction: Intravenous  PONV Risk Score and Plan: 2 and Treatment may vary due to age or medical condition, TIVA and Midazolam  Airway Management Planned: Nasal Cannula  Additional Equipment: None  Intra-op Plan:   Post-operative Plan:   Informed Consent: I have reviewed the patients History and Physical, chart, labs and discussed the procedure including the risks, benefits and alternatives for the proposed anesthesia with the patient or authorized representative who has indicated his/her understanding and acceptance.     Dental advisory given  Plan Discussed with: CRNA  Anesthesia Plan Comments:         Anesthesia Quick Evaluation

## 2021-09-25 NOTE — Anesthesia Postprocedure Evaluation (Signed)
Anesthesia Post Note  Patient: Stephanie Davila  Procedure(s) Performed: CATARACT EXTRACTION PHACO AND INTRAOCULAR LENS PLACEMENT (IOC) LEFT 5.14 00:59.9 (Left: Eye)     Patient location during evaluation: PACU Anesthesia Type: MAC Level of consciousness: awake and alert Pain management: pain level controlled Vital Signs Assessment: post-procedure vital signs reviewed and stable Respiratory status: spontaneous breathing and nonlabored ventilation Cardiovascular status: blood pressure returned to baseline Postop Assessment: no apparent nausea or vomiting Anesthetic complications: no   No notable events documented.  Kiree Dejarnette [employer]

## 2021-09-25 NOTE — Op Note (Signed)
  OPERATIVE NOTE  Stephanie Davila [id number] 09/25/2021   PREOPERATIVE DIAGNOSIS:  Nuclear sclerotic cataract left eye. H25.12   POSTOPERATIVE DIAGNOSIS:    Nuclear sclerotic cataract left eye.     PROCEDURE:  Phacoemusification with posterior chamber intraocular lens placement of the left eye  Ultrasound time: Procedure(s) with comments: CATARACT EXTRACTION PHACO AND INTRAOCULAR LENS PLACEMENT (IOC) LEFT 5.14 00:59.9 (Left) - wants to be as early as possible  LENS:   Implant Name Type Inv. Item Serial No. Manufacturer Lot No. LRB No. Used Action  LENS IOL TECNIS EYHANCE 21.5 - [id number] Intraocular Lens LENS IOL TECNIS EYHANCE 21.5 [id number] JOHNSON   Left 1 Implanted      SURGEON:  Wyonia Hough, MD   ANESTHESIA:  Topical with tetracaine drops and 2% Xylocaine jelly, augmented with 1% preservative-free intracameral lidocaine.    COMPLICATIONS:  None.   DESCRIPTION OF PROCEDURE:  The patient was identified in the holding room and transported to the operating room and placed in the supine position under the operating microscope.  The left eye was identified as the operative eye and it was prepped and draped in the usual sterile ophthalmic fashion.   A 1 millimeter clear-corneal paracentesis was made at the 1:30 position.  0.5 ml of preservative-free 1% lidocaine was injected into the anterior chamber.  The anterior chamber was filled with Viscoat viscoelastic.  A 2.4 millimeter keratome was used to make a near-clear corneal incision at the 10:30 position.  .  A curvilinear capsulorrhexis was made with a cystotome and capsulorrhexis forceps.  Balanced salt solution was used to hydrodissect and hydrodelineate the nucleus.   Phacoemulsification was then used in stop and chop fashion to remove the lens nucleus and epinucleus.  The remaining cortex was then removed using the irrigation and aspiration handpiece. Provisc was then placed into the capsular bag to distend it for  lens placement.  A lens was then injected into the capsular bag.  The remaining viscoelastic was aspirated.   Wounds were hydrated with balanced salt solution.  The anterior chamber was inflated to a physiologic pressure with balanced salt solution.  No wound leaks were noted. Cefuroxime 0.1 ml of a 10mg /ml solution was injected into the anterior chamber for a dose of 1 mg of intracameral antibiotic at the completion of the case.   Timolol and Brimonidine drops were applied to the eye.  The patient was taken to the recovery room in stable condition without complications of anesthesia or surgery.  [name] 09/25/2021, 8:57 AM

## 2021-09-25 NOTE — H&P (Signed)
[place]   Primary Care Physician:  Reynold Bowen, MD Ophthalmologist: Dr. Leandrew Koyanagi  Pre-Procedure History & Physical: HPI:  Stephanie Davila is a 80 y.o. female here for ophthalmic surgery.   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   Hematuria    negative uro eval X 2   Hepatitis 12/08/1974   Hyperlipidemia    Nephrolithiasis    Osteopenia    dexa 2008   Thyroid disease     Past Surgical History:  Procedure Laterality Date   ABDOMINAL HYSTERECTOMY  1996   BSO secondary to AUB and fibroids   BOTOX INJECTION N/A 03/31/2014   Procedure: BOTOX INJECTION;  Surgeon: Jeryl Columbia, MD;  Location: [facility];  Service: Endoscopy;  Laterality: N/A;   ESOPHAGEAL MANOMETRY N/A 03/27/2014   Procedure: ESOPHAGEAL MANOMETRY (EM);  Surgeon: Jeryl Columbia, MD;  Location: [facility];  Service: Endoscopy;  Laterality: N/A;   ESOPHAGOGASTRODUODENOSCOPY N/A 03/31/2014   Procedure: ESOPHAGOGASTRODUODENOSCOPY (EGD);  Surgeon: Jeryl Columbia, MD;  Location: [facility];  Service: Endoscopy;  Laterality: N/A;   ESOPHAGOGASTRODUODENOSCOPY ENDOSCOPY  summer 2013   Dr. Watt Climes   HELLER MYOTOMY  09/07/14   MOHS SURGERY  09/15/13   nose   THYROIDECTOMY, PARTIAL  1994   TONSILLECTOMY      Prior to Admission medications   Medication Sig Start Date End Date Taking? Authorizing Provider  acetaminophen (TYLENOL) 160 MG/5ML solution Take 650 mg by mouth as needed.  09/08/14  Yes [provider]  Calcium Carbonate (CALCIUM 600 PO) Take by mouth.   Yes [provider]  gabapentin (NEURONTIN) 100 MG capsule Take 100 mg by mouth every other day.   Yes [provider]  Ibuprofen (ADVIL PO) Take 1 tablet by mouth as needed.   Yes [provider]  levothyroxine  (SYNTHROID, LEVOTHROID) 112 MCG tablet Take 112 mcg by mouth daily.   Yes [provider]  Multiple Vitamins-Minerals (MULTIVITAMIN ADULTS PO) Take by mouth daily.   Yes [provider]  Multiple Vitamins-Minerals (PRESERVISION AREDS PO) Take 1 tablet by mouth 2 (two) times daily.   Yes [provider]  omeprazole (PRILOSEC) 20 MG capsule Take 20 mg by mouth daily.   Yes [provider]  temazepam (RESTORIL) 15 MG capsule as needed. 08/16/13  Yes [provider]    Allergies as of 07/31/2021 - Review Complete 06/03/2021  Allergen Reaction Noted   Codeine  03/31/2014   Levofloxacin Other (See Comments) 02/14/2021   Penicillins  05/25/2012   Sulfa antibiotics  05/25/2012    Family History  Problem Relation Age of Onset   Hip fracture Mother    Dementia Mother    Coronary artery disease Father 42       CABG   Coronary artery disease Paternal Aunt     Social History   Socioeconomic History   Marital status: Married    Spouse name: Not on file   Number of children: 1   Years of education: Not on file   Highest education level: Not on file  Occupational History   Occupation: Retired Tourist information centre manager  Tobacco Use   Smoking status: Never   Smokeless tobacco: Never  Vaping Use   Vaping Use: Never used  Substance  and Sexual Activity   Alcohol use: Yes    Comment: occasional wine with dinner   Drug use: No   Sexual activity: Not Currently  Other Topics Concern   Not on file  Social History Narrative   Not on file   Social Determinants of Health   Financial Resource Strain: Not on file  Food Insecurity: Not on file  Transportation Needs: Not on file  Physical Activity: Not on file  Stress: Not on file  Social Connections: Not on file  Intimate Partner Violence: Not on file    Review of Systems: See HPI, otherwise negative ROS  Physical Exam: BP (!) 141/78   Pulse 66   Temp (!) 97.3 F (36.3 C) (Temporal)   Resp 18   Ht 5\' 5"   (1.651 m)   Wt 66.3 kg   SpO2 97%   BMI 24.31 kg/m  General:   Alert,  pleasant and cooperative in NAD Head:  Normocephalic and atraumatic. Lungs:  Clear to auscultation.    Heart:  Regular rate and rhythm.   Impression/Plan: Stephanie Davila is here for ophthalmic surgery.  Risks, benefits, limitations, and alternatives regarding ophthalmic surgery have been reviewed with the patient.  Questions have been answered.  All parties agreeable.   Leandrew Koyanagi, MD  09/25/2021, 7:40 AM

## 2021-09-25 NOTE — Anesthesia Procedure Notes (Signed)
Procedure Name: MAC Date/Time: 09/25/2021 8:35 AM Performed by: Jeannene Patella, CRNA Pre-anesthesia Checklist: Patient identified, Emergency Drugs available, Suction available, Timeout performed and Patient being monitored Patient Re-evaluated:Patient Re-evaluated prior to induction Oxygen Delivery Method: Nasal cannula Placement Confirmation: positive ETCO2

## 2021-09-26 ENCOUNTER — Encounter: Payer: Self-pay | Admitting: Ophthalmology

## 2021-11-15 ENCOUNTER — Other Ambulatory Visit: Payer: Self-pay | Admitting: Podiatry

## 2022-05-26 ENCOUNTER — Encounter: Payer: Self-pay | Admitting: Obstetrics and Gynecology

## 2022-06-19 NOTE — Progress Notes (Signed)
Chief Complaint  Patient presents with   Follow-up    CAD   History of Present Illness: 81 yo female with history of mild CAD, hyperlipidemia, thyroid disease and kidney stones who is here today for cardiac follow up. I saw her as a new patient in 2013 but then she was lost to follow up until April 2021. She has been intolerant of statins in the past. In January 2013, she was stressed and felt her heart pounding. She felt that she was having a panic attack. There was no chest pain or SOB at that time. There was some shoulder pain but no chest pain. Echo in 2013 showed LVEF=55-60%. No valve disease. She has achalasia and had a Heller's myotomy. When I saw her in 2021 she c/o pain in her upper chest and into the neck. This happens after meals and mostly at rest. Echo 04/10/20 with OMBT=59-74%, grade 2 diastolic dysfunction. No aortic valve or mitral valve disease. Cardiac CTA in May 2021 with minimal plaque in the Circumflex and RCA.   She is here today for follow up. She tells me that she had some chest pressure yesterday. This started in her epigastric area while walking and is relieved with drinking water. The patient denies any dyspnea, palpitations, lower extremity edema, orthopnea, PND, dizziness, near syncope or syncope.   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   Hematuria    negative uro eval X 2   Hepatitis 12/08/1974   Hyperlipidemia    Nephrolithiasis    Osteopenia    dexa 2008   Thyroid disease     Past Surgical History:  Procedure Laterality Date   ABDOMINAL HYSTERECTOMY  1996   BSO secondary to AUB and fibroids   BOTOX INJECTION N/A 03/31/2014   Procedure: BOTOX INJECTION;  Surgeon: Jeryl Columbia, MD;  Location: [facility];  Service:  Endoscopy;  Laterality: N/A;   CATARACT EXTRACTION W/PHACO Left 09/25/2021   Procedure: CATARACT EXTRACTION PHACO AND INTRAOCULAR LENS PLACEMENT ([place]) LEFT 5.14 00:59.9;  Surgeon: Leandrew Koyanagi, MD;  Location: [facility];  Service: Ophthalmology;  Laterality: Left;  wants to be as early as possible   ESOPHAGEAL MANOMETRY N/A 03/27/2014   Procedure: ESOPHAGEAL MANOMETRY (EM);  Surgeon: Jeryl Columbia, MD;  Location: [facility];  Service: Endoscopy;  Laterality: N/A;   ESOPHAGOGASTRODUODENOSCOPY N/A 03/31/2014   Procedure: ESOPHAGOGASTRODUODENOSCOPY (EGD);  Surgeon: Jeryl Columbia, MD;  Location: [facility];  Service: Endoscopy;  Laterality: N/A;   ESOPHAGOGASTRODUODENOSCOPY ENDOSCOPY  summer 2013   Dr. Watt Climes   HELLER MYOTOMY  09/07/14   MOHS SURGERY  09/15/13   nose   THYROIDECTOMY, PARTIAL  1994   TONSILLECTOMY      Current Outpatient Medications  Medication Sig Dispense Refill   acetaminophen (TYLENOL) 160 MG/5ML solution Take 650 mg by mouth as needed.      Calcium Carbonate (CALCIUM 600 PO) Take by mouth.     gabapentin (NEURONTIN) 100 MG capsule TAKE 1 CAPSULE (100 MG) BY MOUTH AT BEDTIME (Patient taking differently: Take 100 mg by mouth as needed.) 90 capsule 3   Ibuprofen (ADVIL PO) Take 1 tablet by mouth as needed.     levothyroxine (SYNTHROID, LEVOTHROID) 112 MCG  tablet Take 112 mcg by mouth daily.     Multiple Vitamins-Minerals (MULTIVITAMIN ADULTS PO) Take by mouth daily.     Multiple Vitamins-Minerals (PRESERVISION AREDS PO) Take 1 tablet by mouth 2 (two) times daily.     omeprazole (PRILOSEC) 20 MG capsule Take 20 mg by mouth daily.     temazepam (RESTORIL) 15 MG capsule as needed.     No current facility-administered medications for this visit.    Allergies  Allergen Reactions   Codeine     Hyperactive,overly awake   Penicillins    Sulfa Antibiotics     Social History   Socioeconomic History   Marital status: Married    Spouse name: Not on file    Number of children: 1   Years of education: Not on file   Highest education level: Not on file  Occupational History   Occupation: Retired Tourist information centre manager  Tobacco Use   Smoking status: Never   Smokeless tobacco: Never  Vaping Use   Vaping Use: Never used  Substance and Sexual Activity   Alcohol use: Yes    Comment: occasional wine with dinner   Drug use: No   Sexual activity: Not Currently  Other Topics Concern   Not on file  Social History Narrative   Not on file   Social Determinants of Health   Financial Resource Strain: Not on file  Food Insecurity: Not on file  Transportation Needs: Not on file  Physical Activity: Not on file  Stress: Not on file  Social Connections: Not on file  Intimate Partner Violence: Not on file    Family History  Problem Relation Age of Onset   Hip fracture Mother    Dementia Mother    Coronary artery disease Father 36       CABG   Coronary artery disease Paternal Aunt     Review of Systems:  As stated in the HPI and otherwise negative.   BP 126/70   Pulse (!) 58   Ht '5\' 5"'$  (1.651 m)   Wt 143 lb 9.6 oz (65.1 kg)   SpO2 99%   BMI 23.90 kg/m   Physical Examination: General: Well developed, well nourished, NAD  HEENT: OP clear, mucus membranes moist  SKIN: warm, dry. No rashes. Neuro: No focal deficits  Musculoskeletal: Muscle strength 5/5 all ext  Psychiatric: Mood and affect normal  Neck: No JVD, no carotid bruits, no thyromegaly, no lymphadenopathy.  Lungs:Clear bilaterally, no wheezes, rhonci, crackles Cardiovascular: Regular rate and rhythm. No murmurs, gallops or rubs. Abdomen:Soft. Bowel sounds present. Non-tender.  Extremities: No lower extremity edema. Pulses are 2 + in the bilateral DP/PT.  EKG:  EKG is ordered today. The ekg ordered today demonstrates sinus, non-specific ST abn  Echo 04/10/20:  1. Left ventricular ejection fraction, by estimation, is 60 to 65%. The  left ventricle has normal function. The left ventricle  has no regional  wall motion abnormalities. Left ventricular diastolic parameters are  consistent with Grade II diastolic  dysfunction (pseudonormalization).   2. Right ventricular systolic function is normal. The right ventricular  size is normal. There is mildly elevated pulmonary artery systolic  pressure.   3. The mitral valve is normal in structure. No evidence of mitral valve  regurgitation. No evidence of mitral stenosis.   4. Tricuspid valve regurgitation is moderate.   5. The aortic valve is normal in structure. Aortic valve regurgitation is  not visualized. No aortic stenosis is present.   6. The inferior vena cava is  normal in size with greater than 50%  respiratory variability, suggesting right atrial pressure of 3 mmHg.   Recent Labs: No results found for requested labs within last 365 days.   Lipid Panel No results found for: "CHOL", "TRIG", "HDL", "CHOLHDL", "VLDL", "LDLCALC", "LDLDIRECT"   Wt Readings from Last 3 Encounters:  06/20/22 143 lb 9.6 oz (65.1 kg)  09/25/21 146 lb 1.6 oz (66.3 kg)  06/03/21 146 lb 12.8 oz (66.6 kg)    Assessment and Plan:   1. CAD without angina: Coronary CTA in May 2021 with very mild CAD. Echo in 2021 with normal LV function and no aortic or mitral valve disease. No recent chest pains concerning for angina. Continue medical management of mild CAD. She does not wish to take an ASA due to GI issues and she does not wish to take a statin due to prior intolerance.   Labs/ tests ordered today include:   Orders Placed This Encounter  Procedures   EKG 12-Lead   Disposition:   F/U with me in 12 months.   Signed, Lauree Chandler, MD 06/20/2022 11:39 AM    [address], [place], [place]  [address] Phone: [phone]; Fax: [phone]

## 2022-06-20 ENCOUNTER — Encounter: Payer: Self-pay | Admitting: Cardiovascular Disease

## 2022-06-20 ENCOUNTER — Ambulatory Visit: Payer: Medicare PPO | Admitting: Cardiovascular Disease

## 2022-06-20 VITALS — BP 126/70 | HR 58 | Ht 65.0 in | Wt 143.6 lb

## 2022-06-20 DIAGNOSIS — I2510 Atherosclerotic heart disease of native coronary artery without angina pectoris: Secondary | ICD-10-CM

## 2022-06-20 NOTE — Patient Instructions (Signed)
Medication Instructions:  No changes *If you need a refill on your cardiac medications before your next appointment, please call your pharmacy*   Lab Work: none If you have labs (blood work) drawn today and your tests are completely normal, you will receive your results only by: [place] (if you have MyChart) OR A paper copy in the mail If you have any lab test that is abnormal or we need to change your treatment, we will call you to review the results.   Testing/Procedures: none   Follow-Up: At [facility], you and your health needs are our priority.  As part of our continuing mission to provide you with exceptional heart care, we have created designated Provider Care Teams.  These Care Teams include your primary Cardiologist (physician) and Advanced Practice Providers (APPs -  Physician Assistants and Nurse Practitioners) who all work together to provide you with the care you need, when you need it.  We recommend signing up for the patient portal called "MyChart".  Sign up information is provided on this After Visit Summary.  MyChart is used to connect with patients for Virtual Visits (Telemedicine).  Patients are able to view lab/test results, encounter notes, upcoming appointments, etc.  Non-urgent messages can be sent to your provider as well.   To learn more about what you can do with MyChart, go to [email or web address].    Your next appointment:   12 month(s)  The format for your next appointment:   In Person  Provider:   Lauree Chandler, MD     Important Information About Sugar

## 2022-07-22 DIAGNOSIS — R490 Dysphonia: Secondary | ICD-10-CM | POA: Diagnosis not present

## 2022-07-22 DIAGNOSIS — J358 Other chronic diseases of tonsils and adenoids: Secondary | ICD-10-CM | POA: Diagnosis not present

## 2022-07-22 DIAGNOSIS — H6123 Impacted cerumen, bilateral: Secondary | ICD-10-CM | POA: Diagnosis not present

## 2022-09-09 DIAGNOSIS — E039 Hypothyroidism, unspecified: Secondary | ICD-10-CM | POA: Diagnosis not present

## 2022-09-09 DIAGNOSIS — M8580 Other specified disorders of bone density and structure, unspecified site: Secondary | ICD-10-CM | POA: Diagnosis not present

## 2022-09-09 DIAGNOSIS — G4700 Insomnia, unspecified: Secondary | ICD-10-CM | POA: Diagnosis not present

## 2022-09-09 DIAGNOSIS — I2584 Coronary atherosclerosis due to calcified coronary lesion: Secondary | ICD-10-CM | POA: Diagnosis not present

## 2022-09-09 DIAGNOSIS — E042 Nontoxic multinodular goiter: Secondary | ICD-10-CM | POA: Diagnosis not present

## 2022-09-09 DIAGNOSIS — Z23 Encounter for immunization: Secondary | ICD-10-CM | POA: Diagnosis not present

## 2022-09-09 DIAGNOSIS — K219 Gastro-esophageal reflux disease without esophagitis: Secondary | ICD-10-CM | POA: Diagnosis not present

## 2022-09-09 DIAGNOSIS — R7301 Impaired fasting glucose: Secondary | ICD-10-CM | POA: Diagnosis not present

## 2022-09-09 DIAGNOSIS — E785 Hyperlipidemia, unspecified: Secondary | ICD-10-CM | POA: Diagnosis not present

## 2022-09-09 DIAGNOSIS — I2510 Atherosclerotic heart disease of native coronary artery without angina pectoris: Secondary | ICD-10-CM | POA: Diagnosis not present

## 2022-09-10 DIAGNOSIS — L570 Actinic keratosis: Secondary | ICD-10-CM | POA: Diagnosis not present

## 2022-09-10 DIAGNOSIS — L820 Inflamed seborrheic keratosis: Secondary | ICD-10-CM | POA: Diagnosis not present

## 2022-09-10 DIAGNOSIS — D2262 Melanocytic nevi of left upper limb, including shoulder: Secondary | ICD-10-CM | POA: Diagnosis not present

## 2022-09-10 DIAGNOSIS — Z85828 Personal history of other malignant neoplasm of skin: Secondary | ICD-10-CM | POA: Diagnosis not present

## 2022-09-10 DIAGNOSIS — D2272 Melanocytic nevi of left lower limb, including hip: Secondary | ICD-10-CM | POA: Diagnosis not present

## 2022-09-10 DIAGNOSIS — L538 Other specified erythematous conditions: Secondary | ICD-10-CM | POA: Diagnosis not present

## 2022-09-10 DIAGNOSIS — D2261 Melanocytic nevi of right upper limb, including shoulder: Secondary | ICD-10-CM | POA: Diagnosis not present

## 2022-09-10 DIAGNOSIS — R208 Other disturbances of skin sensation: Secondary | ICD-10-CM | POA: Diagnosis not present

## 2022-09-30 DIAGNOSIS — G8929 Other chronic pain: Secondary | ICD-10-CM | POA: Diagnosis not present

## 2022-09-30 DIAGNOSIS — M25552 Pain in left hip: Secondary | ICD-10-CM | POA: Diagnosis not present

## 2022-09-30 DIAGNOSIS — M7062 Trochanteric bursitis, left hip: Secondary | ICD-10-CM | POA: Diagnosis not present

## 2022-10-21 ENCOUNTER — Ambulatory Visit: Payer: Medicare PPO | Attending: Orthopedic Surgery | Admitting: Physical Therapy

## 2022-10-21 ENCOUNTER — Other Ambulatory Visit: Payer: Self-pay

## 2022-10-21 DIAGNOSIS — M25552 Pain in left hip: Secondary | ICD-10-CM | POA: Insufficient documentation

## 2022-10-21 NOTE — Therapy (Unsigned)
OUTPATIENT PHYSICAL THERAPY LOWER EXTREMITY EVALUATION   Patient Name: Stephanie Davila MRN: [id number] DOB:August 16, 1941, 81 y.o., female Today's Date: 10/21/2022   PT End of Session - 10/21/22 1502     Visit Number 1    Number of Visits 20    Date for PT Re-Evaluation 12/30/22    Authorization Type [employer]    Authorization Time Period 10/21/22-12/30/22    Authorization - Visit Number 1    Authorization - Number of Visits 20    Progress Note Due on Visit 10    PT Start Time 8676    PT Stop Time 1500    PT Time Calculation (min) 45 min    Activity Tolerance Patient tolerated treatment well    Behavior During Therapy WFL for tasks assessed/performed             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   Hematuria    negative uro eval X 2   Hepatitis 12/08/1974   Hyperlipidemia    Nephrolithiasis    Osteopenia    dexa 2008   Thyroid disease    Past Surgical History:  Procedure Laterality Date   ABDOMINAL HYSTERECTOMY  1996   BSO secondary to AUB and fibroids   BOTOX INJECTION N/A 03/31/2014   Procedure: BOTOX INJECTION;  Surgeon: Jeryl Columbia, MD;  Location: [facility];  Service: Endoscopy;  Laterality: N/A;   CATARACT EXTRACTION W/PHACO Left 09/25/2021   Procedure: CATARACT EXTRACTION PHACO AND INTRAOCULAR LENS PLACEMENT ([place]) LEFT 5.14 00:59.9;  Surgeon: Leandrew Koyanagi, MD;  Location: [facility];  Service: Ophthalmology;  Laterality: Left;  wants to be as early as possible   ESOPHAGEAL MANOMETRY N/A 03/27/2014   Procedure: ESOPHAGEAL MANOMETRY (EM);  Surgeon: Jeryl Columbia, MD;  Location: [facility];  Service: Endoscopy;  Laterality: N/A;   ESOPHAGOGASTRODUODENOSCOPY N/A 03/31/2014   Procedure: ESOPHAGOGASTRODUODENOSCOPY (EGD);  Surgeon: Jeryl Columbia, MD;   Location: [facility];  Service: Endoscopy;  Laterality: N/A;   ESOPHAGOGASTRODUODENOSCOPY ENDOSCOPY  summer 2013   Dr. Watt Climes   HELLER MYOTOMY  09/07/14   MOHS SURGERY  09/15/13   nose   THYROIDECTOMY, PARTIAL  1994   TONSILLECTOMY     Patient Active Problem List   Diagnosis Date Noted   Acquired trigger finger of right index finger 02/26/2018   Achalasia 09/07/2014   Odynophagia 02/15/2014   Regurgitation 02/15/2014   Unspecified hypothyroidism 09/20/2013   BCC (basal cell carcinoma), face 09/13/2013   Chest pain radiating to arm 05/25/2012    PCP: Dr. Reynold Bowen   REFERRING PROVIDER: Tamala Julian PA-C   REFERRING DIAG: Left Hip Pain   THERAPY DIAG:  Pain in left hip  Rationale for Evaluation and Treatment: Rehabilitation  ONSET DATE: 08/21/22  SUBJECTIVE:   SUBJECTIVE STATEMENT: See pertinent history   PERTINENT HISTORY: Started a a month and a half ago. She was started on meloxicam and it has really helped. She describes being an avid walker and she has been this way for a long time. She does not walk as far. Her left pain radiated down from greater trochanter to lateral left knee.  PAIN:  Are you having pain? Yes: NPRS scale: 5/10 Pain location: Lateral side of left hip  Pain description: Sharp pain  Aggravating factors: Laying on left side Relieving factors: Medications   PRECAUTIONS: None  WEIGHT BEARING RESTRICTIONS: No  FALLS:  Has patient fallen in last 6 months? No  LIVING ENVIRONMENT: Lives with: lives with their spouse Lives in: House/apartment Stairs: Yes: External: 1 steps; none Has following equipment at home: None  OCCUPATION: Retired   PLOF: Independent  PATIENT GOALS: More mobility in her left hip without pain   NEXT MD VISIT: March, 2024   OBJECTIVE:   VITALS: BP 128/68 HR 65 SpO2 98  DIAGNOSTIC FINDINGS: No imaging listed for left hip in chart   PATIENT SURVEYS:  FOTO 63/100 with target of 71   COGNITION: Overall  cognitive status: Within functional limits for tasks assessed     SENSATION: WFL   MUSCLE LENGTH: Hamstrings: Right 60 deg; Left 60 deg Thomas test: Positive bilateral   POSTURE: No Significant postural limitations  PALPATION: Left lateral greater trochanter   LOWER EXTREMITY ROM:   Active  Right 10/21/2022 Left 10/21/2022  Hip flexion 120 120  Hip extension 30 30  Hip abduction 45 45  Hip adduction 30 30  Hip internal rotation 45 45  Hip external rotation 45 45  Knee flexion 135 135  Knee extension 0 0  Ankle dorsiflexion 20 20  Ankle plantarflexion 50 50  Ankle inversion 35 35  Ankle eversion 15 15   (Blank rows = not tested)     LOWER EXTREMITY MMT:  MMT Right eval Left eval  Hip flexion 5 5  Hip extension 4 4  Hip abduction 4 4  Hip adduction 4- 4-  Hip internal rotation    Hip external rotation    Knee flexion 5 5  Knee extension 5 5  Ankle dorsiflexion 5 5  Ankle plantarflexion    Ankle inversion    Ankle eversion     (Blank rows = not tested)  LOWER EXTREMITY SPECIAL TESTS:  Hip special tests: Saralyn Pilar (FABER) test: negative, Trendelenburg test: NT , Thomas test: positive , Ober's test: NT, Ely's test: positive , SI compression test: negative, and Hip scouring test: NT, FADIR: + on left, Hip ER Derotation test: Negative bilateral   FUNCTIONAL TESTS:  5 times sit to stand: NT  30 seconds chair stand test NT  6 minute walk test: NT   GAIT: Distance walked: 50 ft  Assistive device utilized: None Level of assistance: Complete Independence Comments: No gait deficits noted    TODAY'S TREATMENT:                                                                                                                              DATE:   Prone Quad Stretch 2 x 60 sec  Modified Thomas Stretch 2 x 60 sec     PATIENT EDUCATION:  Education details: form and technique for appropriate exercise and explanation of differential diagnosis   Person educated:  Patient Education method: Consulting civil engineer, Media planner, Verbal cues, and Handouts Education comprehension:  verbalized understanding, returned demonstration, and verbal cues required  HOME EXERCISE PROGRAM: Access Code: [email or web address] URL: [email or web address] Date: 10/21/2022 Prepared by: Bradly Chris  Exercises - Prone Quadriceps Stretch with Strap  - 1 x daily - 3 reps - 60 sec hold - Modified Thomas Stretch  - 1 x daily - 3 reps - 60 sec hold  ASSESSMENT:  CLINICAL IMPRESSION: Patient is a 81 y.o. white female who was seen today for physical therapy evaluation and treatment for left hip pain. She presents with signs and symptoms of left hip OA along trochanteric bursitis with focal TTP over left greater trochanter and pain when standing up from seated position after prolonged time sitting along with postive FADIR. Difficult to determine pain response during session, because pt had taken pain meds. She shows deficits that include decreased ability to walk prolonged distances, pain in left hip, and decreased strength and ROM in bilateral hips. She will benefit from skilled PT to address the aforementioned deficits to resume aerobic exercise and to remain physical active to maintain her independence and quality of life.   OBJECTIVE IMPAIRMENTS: decreased mobility, decreased ROM, decreased strength, impaired flexibility, and pain.   ACTIVITY LIMITATIONS: lifting, bending, standing, squatting, stairs, and transfers  PARTICIPATION LIMITATIONS: driving, shopping, community activity, and exercise   PERSONAL FACTORS: Past/current experiences and 1-2 comorbidities: h/o right hip pain, h/o basal cell carcinoma  are also affecting patient's functional outcome.   REHAB POTENTIAL: Good  CLINICAL DECISION MAKING: Stable/uncomplicated  EVALUATION COMPLEXITY: Low   GOALS: Goals reviewed with patient? No  SHORT TERM GOALS: Target date: 11/05/2022  Pt will be independent with HEP in  order to improve strength and balance in order to decrease fall risk and improve function at home and work. Baseline: NT  Goal status: INITIAL    LONG TERM GOALS: Target date: 12/31/2022   Patient will have improved function and activity level as evidenced by an increase in FOTO score by 10 points or more.  Baseline: 63/100 with target of 71  Goal status: INITIAL  2.  Patient will improve hip strength by 1/3 grade MMT (4- to 4) for improved hip stability and potential decrease in hip pain.  Baseline: Hip Ext R/L 4/4, Hip aBd R/L 4/4, Hip Add R/L 4-/4- Goal status: INITIAL  3.  Pt will ambulate for >=1000 ft in the 32mT without stopping for rest break as evidence of improved response to left hip pain and improved function.   Baseline: NT  Goal status: INITIAL     PLAN:  PT FREQUENCY: 1-2x/week  PT DURATION: 10 weeks  PLANNED INTERVENTIONS: Therapeutic exercises, Neuromuscular re-education, Balance training, Gait training, Joint mobilization, Joint manipulation, Stair training, DME instructions, Aquatic Therapy, Dry Needling, Spinal manipulation, Spinal mobilization, Cryotherapy, Moist heat, Traction, Manual therapy, and Re-evaluation  PLAN FOR NEXT SESSION: 638m, Trendlenberg, Squat, 30 sec chair stands, 5 x STS, progress hip strengthening and mobility exercises.  DaBradly ChrisT, DPT  10/21/2022, 3:03 PM

## 2022-10-23 ENCOUNTER — Ambulatory Visit: Payer: Medicare PPO | Admitting: Physical Therapy

## 2022-10-23 ENCOUNTER — Encounter: Payer: Self-pay | Admitting: Physical Therapy

## 2022-10-23 DIAGNOSIS — M25552 Pain in left hip: Secondary | ICD-10-CM

## 2022-10-23 NOTE — Therapy (Signed)
OUTPATIENT PHYSICAL THERAPY TREATMENT NOTE   Patient Name: Stephanie Davila MRN: [id number] DOB:10-01-1941, 81 y.o., female Today's Date: 10/23/2022  PCP: Dr. Reynold Bowen  REFERRING PROVIDER: Tamala Julian PA-C   END OF SESSION:   PT End of Session - 10/23/22 1337     Visit Number 2    Number of Visits 20    Date for PT Re-Evaluation 12/30/22    Authorization Type [employer]    Authorization Time Period 10/21/22-12/30/22    Authorization - Visit Number 2    Authorization - Number of Visits 20    Progress Note Due on Visit 10    PT Start Time 1330    PT Stop Time 1415    PT Time Calculation (min) 45 min    Activity Tolerance Patient tolerated treatment well    Behavior During Therapy WFL for tasks assessed/performed             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   Hematuria    negative uro eval X 2   Hepatitis 12/08/1974   Hyperlipidemia    Nephrolithiasis    Osteopenia    dexa 2008   Thyroid disease    Past Surgical History:  Procedure Laterality Date   ABDOMINAL HYSTERECTOMY  1996   BSO secondary to AUB and fibroids   BOTOX INJECTION N/A 03/31/2014   Procedure: BOTOX INJECTION;  Surgeon: Jeryl Columbia, MD;  Location: [facility];  Service: Endoscopy;  Laterality: N/A;   CATARACT EXTRACTION W/PHACO Left 09/25/2021   Procedure: CATARACT EXTRACTION PHACO AND INTRAOCULAR LENS PLACEMENT ([place]) LEFT 5.14 00:59.9;  Surgeon: Leandrew Koyanagi, MD;  Location: [facility];  Service: Ophthalmology;  Laterality: Left;  wants to be as early as possible   ESOPHAGEAL MANOMETRY N/A 03/27/2014   Procedure: ESOPHAGEAL MANOMETRY (EM);  Surgeon: Jeryl Columbia, MD;  Location: [facility];  Service: Endoscopy;  Laterality: N/A;   ESOPHAGOGASTRODUODENOSCOPY N/A 03/31/2014    Procedure: ESOPHAGOGASTRODUODENOSCOPY (EGD);  Surgeon: Jeryl Columbia, MD;  Location: [facility];  Service: Endoscopy;  Laterality: N/A;   ESOPHAGOGASTRODUODENOSCOPY ENDOSCOPY  summer 2013   Dr. Watt Climes   HELLER MYOTOMY  09/07/14   MOHS SURGERY  09/15/13   nose   THYROIDECTOMY, PARTIAL  1994   TONSILLECTOMY     Patient Active Problem List   Diagnosis Date Noted   Acquired trigger finger of right index finger 02/26/2018   Achalasia 09/07/2014   Odynophagia 02/15/2014   Regurgitation 02/15/2014   Unspecified hypothyroidism 09/20/2013   BCC (basal cell carcinoma), face 09/13/2013   Chest pain radiating to arm 05/25/2012    REFERRING DIAG: Trochanteric Bursitis L hip  THERAPY DIAG:  Pain in left hip  [facility] for Evaluation and Treatment   PERTINENT HISTORY: Started a a month and a half ago. She was started on meloxicam and it has really helped. She describes being an avid walker and she has been this way for a long time. She does not walk as far. Her left pain radiated down from greater trochanter to lateral left knee.    PRECAUTIONS: None   SUBJECTIVE:  SUBJECTIVE STATEMENT:  Pt reports no increase pain after performing the exercises.    PAIN:  Are you having pain? No   OBJECTIVE: (objective measures completed at initial evaluation unless otherwise dated)  VITALS: BP 128/68 HR 65 SpO2 98   DIAGNOSTIC FINDINGS: No imaging listed for left hip in chart    PATIENT SURVEYS:  FOTO 63/100 with target of 71    COGNITION: Overall cognitive status: Within functional limits for tasks assessed                         SENSATION: WFL     MUSCLE LENGTH: Hamstrings: Right 60 deg; Left 60 deg Thomas test: Positive bilateral    POSTURE: No Significant postural limitations    PALPATION: Left lateral greater trochanter    LOWER EXTREMITY ROM:     Active  Right 10/21/2022 Left 10/21/2022  Hip flexion 120 120  Hip extension 30 30  Hip abduction 45 45  Hip adduction 30 30  Hip internal rotation 45 45  Hip external rotation 45 45  Knee flexion 135 135  Knee extension 0 0  Ankle dorsiflexion 20 20  Ankle plantarflexion 50 50  Ankle inversion 35 35  Ankle eversion 15 15   (Blank rows = not tested)        LOWER EXTREMITY MMT:   MMT Right eval Left eval  Hip flexion 5 5  Hip extension 4 4  Hip abduction 4 4  Hip adduction 4- 4-  Hip internal rotation      Hip external rotation      Knee flexion 5 5  Knee extension 5 5  Ankle dorsiflexion 5 5  Ankle plantarflexion      Ankle inversion      Ankle eversion       (Blank rows = not tested)   LOWER EXTREMITY SPECIAL TESTS:  Hip special tests: Saralyn Pilar (FABER) test: negative, Trendelenburg test: NT , Thomas test: positive , Ober's test: NT, Ely's test: positive , SI compression test: negative, and Hip scouring test: NT, FADIR: + on left, Hip ER Derotation test: Negative bilateral    FUNCTIONAL TESTS:  5 times sit to stand: NT  30 seconds chair stand test NT  6 minute walk test: NT  Squat: Increased forward flexion  Single Leg Stance: Bilateral Trendelenburg    GAIT: Distance walked: 50 ft  Assistive device utilized: None Level of assistance: Complete Independence Comments: No gait deficits noted      TODAY'S TREATMENT:                                                                                                                              DATE:   10/23/22  67mT: 1,250 ft   Lateral Lunges with BUE 2 x 10  Lateral Lunges 2 x 10  Lateral Lunges 2 x 10 #5 DB  Mini-Squats 1 x 10  Mini-Squats 2 x 10 from  27 inch mat height  Standing Hip Abduction with BUE support 3 x 10     10/21/22   Prone Quad Stretch 2 x 60 sec  Modified Thomas Stretch 2 x 60 sec       PATIENT EDUCATION:   Education details: form and technique for appropriate exercise and explanation of differential diagnosis   Person educated: Patient Education method: Consulting civil engineer, Media planner, Verbal cues, and Handouts Education comprehension: verbalized understanding, returned demonstration, and verbal cues required   HOME EXERCISE PROGRAM: Access Code: [email or web address] URL: [email or web address] Date: 10/23/2022 Prepared by: Bradly Chris  Exercises - Prone Quadriceps Stretch with Strap  - 1 x daily - 3 reps - 60 sec hold - Modified Thomas Stretch  - 1 x daily - 3 reps - 60 sec hold - Lateral Lunge  - 3 x weekly - 3 sets - 10 reps - Mini Squat  - 3 x weekly - 3 sets - 10 reps - Standing Hip Abduction with Counter Support  - 3 x weekly - 3 sets - 10 reps   ASSESSMENT:   CLINICAL IMPRESSION:           Pt presents for f/u for left hip pain in the setting of left hip OA and trochanteric bursitis. She was able to complete all exercises without an increase in her left hip pain and minor increase in left knee pain with lateral lunges. Pt's aerobic capacity is well beyond community ambulator status and she is not limited by her pain. She will benefit from skilled PT to address the aforementioned deficits to resume aerobic exercise and to remain physical active to maintain her independence and quality of life.   OBJECTIVE IMPAIRMENTS: decreased mobility, decreased ROM, decreased strength, impaired flexibility, and pain.    ACTIVITY LIMITATIONS: lifting, bending, standing, squatting, stairs, and transfers   PARTICIPATION LIMITATIONS: driving, shopping, community activity, and exercise    PERSONAL FACTORS: Past/current experiences and 1-2 comorbidities: h/o right hip pain, h/o basal cell carcinoma  are also affecting patient's functional outcome.    REHAB POTENTIAL: Good   CLINICAL DECISION MAKING: Stable/uncomplicated   EVALUATION COMPLEXITY: Low     GOALS: Goals reviewed with patient? No    SHORT TERM GOALS: Target date: 11/05/2022  Pt will be independent with HEP in order to improve strength and balance in order to decrease fall risk and improve function at home and work. Baseline:  Performing independently  Goal status: Ongoing        LONG TERM GOALS: Target date: 12/31/2022    Patient will have improved function and activity level as evidenced by an increase in FOTO score by 10 points or more.  Baseline: 63/100 with target of 71  Goal status: Ongoing    2.  Patient will improve hip strength by 1/3 grade MMT (4- to 4) for improved hip stability and potential decrease in hip pain.  Baseline: Hip Ext R/L 4/4, Hip aBd R/L 4/4, Hip Add R/L 4-/4- Goal status: Ongoing    3.  Pt will ambulate for >=1000 ft in the 30mT without stopping for rest break as evidence of improved response to left hip pain and improved function.   Baseline: 1,250 ft  Goal status: Ongoing          PLAN:   PT FREQUENCY: 1-2x/week   PT DURATION: 10 weeks   PLANNED INTERVENTIONS: Therapeutic exercises, Neuromuscular re-education, Balance training, Gait training, Joint mobilization, Joint manipulation, Stair training, DME instructions, Aquatic Therapy, Dry Needling, Spinal manipulation, Spinal mobilization, Cryotherapy, Moist  heat, Traction, Manual therapy, and Re-evaluation   PLAN FOR NEXT SESSION:   Progress hip strengthening and mobility exercises.   Bradly Chris PT, DPT  10/23/2022, 1:38 PM

## 2022-10-27 ENCOUNTER — Ambulatory Visit: Payer: Medicare PPO | Admitting: Physical Therapy

## 2022-10-27 ENCOUNTER — Telehealth: Payer: Self-pay | Admitting: Physical Therapy

## 2022-10-27 ENCOUNTER — Encounter: Payer: Self-pay | Admitting: Physical Therapy

## 2022-10-27 DIAGNOSIS — M25552 Pain in left hip: Secondary | ICD-10-CM | POA: Diagnosis not present

## 2022-10-27 NOTE — Telephone Encounter (Signed)
Disregard message. This was made in error.

## 2022-10-27 NOTE — Therapy (Signed)
OUTPATIENT PHYSICAL THERAPY TREATMENT NOTE   Patient Name: Stephanie Davila MRN: [id number] DOB:03-22-1941, 81 y.o., female Today's Date: 10/27/2022  PCP: Dr. Reynold Bowen  REFERRING PROVIDER: Tamala Julian PA-C   END OF SESSION:   PT End of Session - 10/27/22 1021     Visit Number 3    Number of Visits 20    Date for PT Re-Evaluation 12/30/22    Authorization Type [employer]    Authorization Time Period 10/21/22-12/30/22    Authorization - Visit Number 3    Authorization - Number of Visits 20    Progress Note Due on Visit 10    PT Start Time 1115    PT Stop Time 1100    PT Time Calculation (min) 45 min    Activity Tolerance Patient tolerated treatment well    Behavior During Therapy WFL for tasks assessed/performed             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   Hematuria    negative uro eval X 2   Hepatitis 12/08/1974   Hyperlipidemia    Nephrolithiasis    Osteopenia    dexa 2008   Thyroid disease    Past Surgical History:  Procedure Laterality Date   ABDOMINAL HYSTERECTOMY  1996   BSO secondary to AUB and fibroids   BOTOX INJECTION N/A 03/31/2014   Procedure: BOTOX INJECTION;  Surgeon: Jeryl Columbia, MD;  Location: [facility];  Service: Endoscopy;  Laterality: N/A;   CATARACT EXTRACTION W/PHACO Left 09/25/2021   Procedure: CATARACT EXTRACTION PHACO AND INTRAOCULAR LENS PLACEMENT ([place]) LEFT 5.14 00:59.9;  Surgeon: Leandrew Koyanagi, MD;  Location: [place];  Service: Ophthalmology;  Laterality: Left;  wants to be as early as possible   ESOPHAGEAL MANOMETRY N/A 03/27/2014   Procedure: ESOPHAGEAL MANOMETRY (EM);  Surgeon: Jeryl Columbia, MD;  Location: [facility];  Service: Endoscopy;  Laterality: N/A;   ESOPHAGOGASTRODUODENOSCOPY N/A 03/31/2014    Procedure: ESOPHAGOGASTRODUODENOSCOPY (EGD);  Surgeon: Jeryl Columbia, MD;  Location: [facility];  Service: Endoscopy;  Laterality: N/A;   ESOPHAGOGASTRODUODENOSCOPY ENDOSCOPY  summer 2013   Dr. Watt Climes   HELLER MYOTOMY  09/07/14   MOHS SURGERY  09/15/13   nose   THYROIDECTOMY, PARTIAL  1994   TONSILLECTOMY     Patient Active Problem List   Diagnosis Date Noted   Acquired trigger finger of right index finger 02/26/2018   Achalasia 09/07/2014   Odynophagia 02/15/2014   Regurgitation 02/15/2014   Unspecified hypothyroidism 09/20/2013   BCC (basal cell carcinoma), face 09/13/2013   Chest pain radiating to arm 05/25/2012    REFERRING DIAG: Trochanteric Bursitis L hip  THERAPY DIAG:  Pain in left hip  [facility] for Evaluation and Treatment   PERTINENT HISTORY: Started a a month and a half ago. She was started on meloxicam and it has really helped. She describes being an avid walker and she has been this way for a long time. She does not walk as far. Her left pain radiated down from greater trochanter to lateral left knee.    PRECAUTIONS: None   SUBJECTIVE:  SUBJECTIVE STATEMENT:  Pt reports that she was doing a lot over the weekend with moving furniture around her house. She felt some pain in her right knee but no pain in her left hip.     PAIN:  Are you having pain? Yes: NPRS scale: 2-3/10 Pain location: Right knee  Pain description: Achy  Aggravating factors: Weight bearing and walking  Relieving factors: Sitting still    OBJECTIVE: (objective measures completed at initial evaluation unless otherwise dated)  VITALS: BP 128/68 HR 65 SpO2 98   DIAGNOSTIC FINDINGS: No imaging listed for left hip in chart    PATIENT SURVEYS:  FOTO 63/100 with target of 71    COGNITION: Overall  cognitive status: Within functional limits for tasks assessed                         SENSATION: WFL     MUSCLE LENGTH: Hamstrings: Right 60 deg; Left 60 deg Thomas test: Positive bilateral    POSTURE: No Significant postural limitations   PALPATION: Left lateral greater trochanter    LOWER EXTREMITY ROM:     Active  Right 10/21/2022 Left 10/21/2022  Hip flexion 120 120  Hip extension 30 30  Hip abduction 45 45  Hip adduction 30 30  Hip internal rotation 45 45  Hip external rotation 45 45  Knee flexion 135 135  Knee extension 0 0  Ankle dorsiflexion 20 20  Ankle plantarflexion 50 50  Ankle inversion 35 35  Ankle eversion 15 15   (Blank rows = not tested)        LOWER EXTREMITY MMT:   MMT Right eval Left eval  Hip flexion 5 5  Hip extension 4 4  Hip abduction 4 4  Hip adduction 4- 4-  Hip internal rotation      Hip external rotation      Knee flexion 5 5  Knee extension 5 5  Ankle dorsiflexion 5 5  Ankle plantarflexion      Ankle inversion      Ankle eversion       (Blank rows = not tested)   LOWER EXTREMITY SPECIAL TESTS:  Hip special tests: Saralyn Pilar (FABER) test: negative, Trendelenburg test: NT , Thomas test: positive , Ober's test: NT, Ely's test: positive , SI compression test: negative, and Hip scouring test: NT, FADIR: + on left, Hip ER Derotation test: Negative bilateral    FUNCTIONAL TESTS:  5 times sit to stand: NT  30 seconds chair stand test NT  6 minute walk test: NT  Squat: Increased forward flexion  Single Leg Stance: Bilateral Trendelenburg    GAIT: Distance walked: 50 ft  Assistive device utilized: None Level of assistance: Complete Independence Comments: No gait deficits noted      TODAY'S TREATMENT:  DATE:   10/27/22: Recumbent Bicycle with seat 10 and resistance 2 for 5 min  Side Step off 4 inch  step on LLE with 1 UE 1 x 10  Side Step off 4 inch step onto on LLE with 1 UE 1 x 10  Front Step down 4 inch step on LLE with 1 UE 2 x 10  Front Step down 4 inch step on RLE with 1 UE 3 x 10  Lunges with gallon water jug 3 x 10   10/23/22  63mT: 1,250 ft   Lateral Lunges with BUE 2 x 10  Lateral Lunges 2 x 10  Lateral Lunges 2 x 10 #5 DB  Mini-Squats 1 x 10  Mini-Squats 2 x 10 from 27 inch mat height  Standing Hip Abduction with BUE support 3 x 10     10/21/22   Prone Quad Stretch 2 x 60 sec  Modified Thomas Stretch 2 x 60 sec       PATIENT EDUCATION:  Education details: form and technique for appropriate exercise and explanation of differential diagnosis   Person educated: Patient Education method: EConsulting civil engineer DMedia planner Verbal cues, and Handouts Education comprehension: verbalized understanding, returned demonstration, and verbal cues required   HOME EXERCISE PROGRAM: Access Code: [email or web address] URL: [email or web address] Date: 10/27/2022 Prepared by: DBradly Chris Exercises - Prone Quadriceps Stretch with Strap  - 1 x daily - 3 reps - 60 sec hold - Modified Thomas Stretch  - 1 x daily - 3 reps - 60 sec hold - Lateral Lunge  - 3 x weekly - 3 sets - 10 reps - Mini Squat  - 3 x weekly - 3 sets - 10 reps - Standing Hip Abduction with Counter Support  - 3 x weekly - 3 sets - 10 reps - Side Step Down with Counter Support  - 3 x weekly - 3 sets - 10 reps - Forward Step Down  - 3 x weekly - 3 sets - 10 reps   ASSESSMENT:   CLINICAL IMPRESSION:  Pt shows improvement in LE strength with ability to perform eccentric glute and quad control with forward and side step down. Pt required mod VC to sequence exercise especially side step down with heel tap. She did not experience an increase in left sided hip pain. She will benefit from skilled PT to address the aforementioned deficits to resume aerobic exercise and to remain physical active to maintain her  independence and quality of life.          OBJECTIVE IMPAIRMENTS: decreased mobility, decreased ROM, decreased strength, impaired flexibility, and pain.    ACTIVITY LIMITATIONS: lifting, bending, standing, squatting, stairs, and transfers   PARTICIPATION LIMITATIONS: driving, shopping, community activity, and exercise    PERSONAL FACTORS: Past/current experiences and 1-2 comorbidities: h/o right hip pain, h/o basal cell carcinoma  are also affecting patient's functional outcome.    REHAB POTENTIAL: Good   CLINICAL DECISION MAKING: Stable/uncomplicated   EVALUATION COMPLEXITY: Low     GOALS: Goals reviewed with patient? No   SHORT TERM GOALS: Target date: 11/05/2022  Pt will be independent with HEP in order to improve strength and balance in order to decrease fall risk and improve function at home and work. Baseline:  Performing independently  Goal status: Ongoing        LONG TERM GOALS: Target date: 12/31/2022    Patient will have improved function and activity level as evidenced by an increase in FOTO score by 10 points or more.  Baseline: 63/100  with target of 71  Goal status: Ongoing    2.  Patient will improve hip strength by 1/3 grade MMT (4- to 4) for improved hip stability and potential decrease in hip pain.  Baseline: Hip Ext R/L 4/4, Hip aBd R/L 4/4, Hip Add R/L 4-/4- Goal status: Ongoing    3.  Pt will ambulate for >=1000 ft in the 42mT without stopping for rest break as evidence of improved response to left hip pain and improved function.   Baseline: 1,250 ft  Goal status: Deferred          PLAN:   PT FREQUENCY: 1-2x/week   PT DURATION: 10 weeks   PLANNED INTERVENTIONS: Therapeutic exercises, Neuromuscular re-education, Balance training, Gait training, Joint mobilization, Joint manipulation, Stair training, DME instructions, Aquatic Therapy, Dry Needling, Spinal manipulation, Spinal mobilization, Cryotherapy, Moist heat, Traction, Manual therapy, and  Re-evaluation   PLAN FOR NEXT SESSION:   Progress hip strengthening and mobility exercises: TFL strengthening exercise in side lying, runner step up, OMEGA machines for hip strengthening.   DBradly ChrisPT, DPT  10/27/2022, 10:22 AM

## 2022-10-28 ENCOUNTER — Encounter: Payer: Medicare PPO | Admitting: Physical Therapy

## 2022-10-28 DIAGNOSIS — H353132 Nonexudative age-related macular degeneration, bilateral, intermediate dry stage: Secondary | ICD-10-CM | POA: Diagnosis not present

## 2022-11-03 ENCOUNTER — Ambulatory Visit: Payer: Medicare PPO

## 2022-11-03 ENCOUNTER — Encounter: Payer: Self-pay | Admitting: Physical Therapy

## 2022-11-03 DIAGNOSIS — M25552 Pain in left hip: Secondary | ICD-10-CM | POA: Diagnosis not present

## 2022-11-03 NOTE — Therapy (Signed)
OUTPATIENT PHYSICAL THERAPY TREATMENT NOTE   Patient Name: Stephanie Davila MRN: [id number] DOB:1941-04-12, 81 y.o., female Today's Date: 11/03/2022  PCP: Dr. Reynold Bowen  REFERRING PROVIDER: Tamala Julian PA-C   END OF SESSION:   PT End of Session - 11/03/22 0935     Visit Number 4    Number of Visits 20    Date for PT Re-Evaluation 12/30/22    Authorization Type [employer]    Authorization Time Period 10/21/22-12/30/22    Authorization - Number of Visits 20    Progress Note Due on Visit 10    PT Start Time 0932    PT Stop Time 1015    PT Time Calculation (min) 43 min    Activity Tolerance Patient tolerated treatment well    Behavior During Therapy WFL for tasks assessed/performed             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   Hematuria    negative uro eval X 2   Hepatitis 12/08/1974   Hyperlipidemia    Nephrolithiasis    Osteopenia    dexa 2008   Thyroid disease    Past Surgical History:  Procedure Laterality Date   ABDOMINAL HYSTERECTOMY  1996   BSO secondary to AUB and fibroids   BOTOX INJECTION N/A 03/31/2014   Procedure: BOTOX INJECTION;  Surgeon: Jeryl Columbia, MD;  Location: [facility];  Service: Endoscopy;  Laterality: N/A;   CATARACT EXTRACTION W/PHACO Left 09/25/2021   Procedure: CATARACT EXTRACTION PHACO AND INTRAOCULAR LENS PLACEMENT ([place]) LEFT 5.14 00:59.9;  Surgeon: Leandrew Koyanagi, MD;  Location: [facility];  Service: Ophthalmology;  Laterality: Left;  wants to be as early as possible   ESOPHAGEAL MANOMETRY N/A 03/27/2014   Procedure: ESOPHAGEAL MANOMETRY (EM);  Surgeon: Jeryl Columbia, MD;  Location: [facility];  Service: Endoscopy;  Laterality: N/A;   ESOPHAGOGASTRODUODENOSCOPY N/A 03/31/2014   Procedure: ESOPHAGOGASTRODUODENOSCOPY  (EGD);  Surgeon: Jeryl Columbia, MD;  Location: [facility];  Service: Endoscopy;  Laterality: N/A;   ESOPHAGOGASTRODUODENOSCOPY ENDOSCOPY  summer 2013   Dr. Watt Climes   HELLER MYOTOMY  09/07/14   MOHS SURGERY  09/15/13   nose   THYROIDECTOMY, PARTIAL  1994   TONSILLECTOMY     Patient Active Problem List   Diagnosis Date Noted   Acquired trigger finger of right index finger 02/26/2018   Achalasia 09/07/2014   Odynophagia 02/15/2014   Regurgitation 02/15/2014   Unspecified hypothyroidism 09/20/2013   BCC (basal cell carcinoma), face 09/13/2013   Chest pain radiating to arm 05/25/2012    REFERRING DIAG: Trochanteric Bursitis L hip  THERAPY DIAG:  Pain in left hip  [facility] for Evaluation and Treatment   PERTINENT HISTORY: Started a a month and a half ago. She was started on meloxicam and it has really helped. She describes being an avid walker and she has been this way for a long time. She does not walk as far. Her left pain radiated down from greater trochanter to lateral left knee.    PRECAUTIONS: None   SUBJECTIVE:  SUBJECTIVE STATEMENT:  Pt reports R knee pain bugging her and was aggravated after last session. Reports mild L hip pain and R hip pain.    PAIN:  Are you having pain? Yes: NPRS scale: 2-3/10 Pain location: Right knee  Pain description: Achy  Aggravating factors: Weight bearing and walking  Relieving factors: Sitting still    OBJECTIVE: (objective measures completed at initial evaluation unless otherwise dated)  VITALS: BP 128/68 HR 65 SpO2 98   DIAGNOSTIC FINDINGS: No imaging listed for left hip in chart    PATIENT SURVEYS:  FOTO 63/100 with target of 71    COGNITION: Overall cognitive status: Within functional limits for tasks assessed                          SENSATION: WFL     MUSCLE LENGTH: Hamstrings: Right 60 deg; Left 60 deg Thomas test: Positive bilateral    POSTURE: No Significant postural limitations   PALPATION: Left lateral greater trochanter    LOWER EXTREMITY ROM:     Active  Right 10/21/2022 Left 10/21/2022  Hip flexion 120 120  Hip extension 30 30  Hip abduction 45 45  Hip adduction 30 30  Hip internal rotation 45 45  Hip external rotation 45 45  Knee flexion 135 135  Knee extension 0 0  Ankle dorsiflexion 20 20  Ankle plantarflexion 50 50  Ankle inversion 35 35  Ankle eversion 15 15   (Blank rows = not tested)        LOWER EXTREMITY MMT:   MMT Right eval Left eval  Hip flexion 5 5  Hip extension 4 4  Hip abduction 4 4  Hip adduction 4- 4-  Hip internal rotation      Hip external rotation      Knee flexion 5 5  Knee extension 5 5  Ankle dorsiflexion 5 5  Ankle plantarflexion      Ankle inversion      Ankle eversion       (Blank rows = not tested)   LOWER EXTREMITY SPECIAL TESTS:  Hip special tests: Saralyn Pilar (FABER) test: negative, Trendelenburg test: NT , Thomas test: positive , Ober's test: NT, Ely's test: positive , SI compression test: negative, and Hip scouring test: NT, FADIR: + on left, Hip ER Derotation test: Negative bilateral    FUNCTIONAL TESTS:  5 times sit to stand: NT  30 seconds chair stand test NT  6 minute walk test: NT  Squat: Increased forward flexion  Single Leg Stance: Bilateral Trendelenburg    GAIT: Distance walked: 50 ft  Assistive device utilized: None Level of assistance: Complete Independence Comments: No gait deficits noted      TODAY'S TREATMENT:                                                                                                                              DATE:   11/03/22: Recumbent  Bicycle with seat 10 and resistance 2 for 5 min   Forward alternating step ups 4" step: 2x10/side. SBA.   Lateral side steps 4" step: 2x10/side. SBA.  R/L   side lying: min to mod multimodal cues for form/technique for specific targeted musculature. Good carryover after cues.  Hip abduction: 3x8   Clam shells: 3x8  Hook lying:   Glut bridges with RTB at distal femur: x12. Reports easy. Progressed to blue TB, 3x12.    Gait around gym 200' to assess symptoms. L hip pain improved, remains with R knee discomfort despite adjustment to OKC/supine exercises.   PATIENT EDUCATION:  Education details: form and technique for appropriate exercise and explanation of differential diagnosis   Person educated: Patient Education method: Explanation, Demonstration, Verbal cues, and Handouts Education comprehension: verbalized understanding, returned demonstration, and verbal cues required   HOME EXERCISE PROGRAM: Access Code: [email or web address] URL: [email or web address] Date: 10/27/2022 Prepared by: Bradly Chris  Exercises - Prone Quadriceps Stretch with Strap  - 1 x daily - 3 reps - 60 sec hold - Modified Thomas Stretch  - 1 x daily - 3 reps - 60 sec hold - Lateral Lunge  - 3 x weekly - 3 sets - 10 reps - Mini Squat  - 3 x weekly - 3 sets - 10 reps - Standing Hip Abduction with Counter Support  - 3 x weekly - 3 sets - 10 reps - Side Step Down with Counter Support  - 3 x weekly - 3 sets - 10 reps - Forward Step Down  - 3 x weekly - 3 sets - 10 reps   ASSESSMENT:   CLINICAL IMPRESSION: Continuing PT POC with focus on L hip strengthening. Regressed therex to [facility] resistance due to continuous onset of R knee pain with WB therex. Successful reduced R knee pain with changes in exercise during exercises. Will continue PT POC with progress as able to reduce L hip pain while mitigating onset of R knee pain. She will benefit from skilled PT to address the aforementioned deficits to resume aerobic exercise and to remain physical active to maintain her independence and quality of life.            OBJECTIVE IMPAIRMENTS: decreased mobility, decreased ROM,  decreased strength, impaired flexibility, and pain.    ACTIVITY LIMITATIONS: lifting, bending, standing, squatting, stairs, and transfers   PARTICIPATION LIMITATIONS: driving, shopping, community activity, and exercise    PERSONAL FACTORS: Past/current experiences and 1-2 comorbidities: h/o right hip pain, h/o basal cell carcinoma  are also affecting patient's functional outcome.    REHAB POTENTIAL: Good   CLINICAL DECISION MAKING: Stable/uncomplicated   EVALUATION COMPLEXITY: Low     GOALS: Goals reviewed with patient? No   SHORT TERM GOALS: Target date: 11/05/2022  Pt will be independent with HEP in order to improve strength and balance in order to decrease fall risk and improve function at home and work. Baseline:  Performing independently  Goal status: Ongoing        LONG TERM GOALS: Target date: 12/31/2022    Patient will have improved function and activity level as evidenced by an increase in FOTO score by 10 points or more.  Baseline: 63/100 with target of 71  Goal status: Ongoing    2.  Patient will improve hip strength by 1/3 grade MMT (4- to 4) for improved hip stability and potential decrease in hip pain.  Baseline: Hip Ext R/L 4/4, Hip aBd R/L 4/4, Hip Add R/L 4-/4- Goal status: Ongoing  3.  Pt will ambulate for >=1000 ft in the 75mT without stopping for rest break as evidence of improved response to left hip pain and improved function.   Baseline: 1,250 ft  Goal status: Deferred          PLAN:   PT FREQUENCY: 1-2x/week   PT DURATION: 10 weeks   PLANNED INTERVENTIONS: Therapeutic exercises, Neuromuscular re-education, Balance training, Gait training, Joint mobilization, Joint manipulation, Stair training, DME instructions, Aquatic Therapy, Dry Needling, Spinal manipulation, Spinal mobilization, Cryotherapy, Moist heat, Traction, Manual therapy, and Re-evaluation   PLAN FOR NEXT SESSION:   Progress hip strengthening and mobility exercises: TFL  strengthening exercise in side lying, runner step up, OMEGA machines for hip strengthening.   MSalem Caster Fairly IV, PT, DPT Physical Therapist- [facility] 11/03/2022, 10:46 AM

## 2022-11-06 ENCOUNTER — Ambulatory Visit: Payer: Medicare PPO | Admitting: Physical Therapy

## 2022-11-10 ENCOUNTER — Ambulatory Visit: Payer: Medicare PPO | Attending: Orthopedic Surgery

## 2022-11-10 DIAGNOSIS — M25552 Pain in left hip: Secondary | ICD-10-CM | POA: Insufficient documentation

## 2022-11-10 NOTE — Therapy (Signed)
OUTPATIENT PHYSICAL THERAPY TREATMENT NOTE   Patient Name: Stephanie Davila MRN: [id number] DOB:1941/08/15, 81 y.o., female Today's Date: 11/10/2022  PCP: Dr. Reynold Bowen  REFERRING PROVIDER: Tamala Julian PA-C   END OF SESSION:   PT End of Session - 11/10/22 1105     Visit Number 5    Number of Visits 20    Date for PT Re-Evaluation 12/30/22    Authorization Type [employer]    Authorization Time Period 10/21/22-12/30/22    Authorization - Visit Number 5    Authorization - Number of Visits 20    Progress Note Due on Visit 10    PT Start Time 1101    PT Stop Time 1145    PT Time Calculation (min) 44 min    Activity Tolerance Patient tolerated treatment well    Behavior During Therapy WFL for tasks assessed/performed             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   Hematuria    negative uro eval X 2   Hepatitis 12/08/1974   Hyperlipidemia    Nephrolithiasis    Osteopenia    dexa 2008   Thyroid disease    Past Surgical History:  Procedure Laterality Date   ABDOMINAL HYSTERECTOMY  1996   BSO secondary to AUB and fibroids   BOTOX INJECTION N/A 03/31/2014   Procedure: BOTOX INJECTION;  Surgeon: Jeryl Columbia, MD;  Location: [facility];  Service: Endoscopy;  Laterality: N/A;   CATARACT EXTRACTION W/PHACO Left 09/25/2021   Procedure: CATARACT EXTRACTION PHACO AND INTRAOCULAR LENS PLACEMENT ([place]) LEFT 5.14 00:59.9;  Surgeon: Leandrew Koyanagi, MD;  Location: [place];  Service: Ophthalmology;  Laterality: Left;  wants to be as early as possible   ESOPHAGEAL MANOMETRY N/A 03/27/2014   Procedure: ESOPHAGEAL MANOMETRY (EM);  Surgeon: Jeryl Columbia, MD;  Location: [facility];  Service: Endoscopy;  Laterality: N/A;   ESOPHAGOGASTRODUODENOSCOPY N/A 03/31/2014    Procedure: ESOPHAGOGASTRODUODENOSCOPY (EGD);  Surgeon: Jeryl Columbia, MD;  Location: [facility];  Service: Endoscopy;  Laterality: N/A;   ESOPHAGOGASTRODUODENOSCOPY ENDOSCOPY  summer 2013   Dr. Watt Climes   HELLER MYOTOMY  09/07/14   MOHS SURGERY  09/15/13   nose   THYROIDECTOMY, PARTIAL  1994   TONSILLECTOMY     Patient Active Problem List   Diagnosis Date Noted   Acquired trigger finger of right index finger 02/26/2018   Achalasia 09/07/2014   Odynophagia 02/15/2014   Regurgitation 02/15/2014   Unspecified hypothyroidism 09/20/2013   BCC (basal cell carcinoma), face 09/13/2013   Chest pain radiating to arm 05/25/2012    REFERRING DIAG: Trochanteric Bursitis L hip  THERAPY DIAG:  Pain in left hip  [facility] for Evaluation and Treatment   PERTINENT HISTORY: Started a a month and a half ago. She was started on meloxicam and it has really helped. She describes being an avid walker and she has been this way for a long time. She does not walk as far. Her left pain radiated down from greater trochanter to lateral left knee.    PRECAUTIONS: None   SUBJECTIVE:  SUBJECTIVE STATEMENT:  Pt reports R knee pain bugging her going up and down steps for the attic. L hip is improved. Had joint crepitus in R knee and with cavitation. After cavitation she felt relief.   PAIN:  Are you having pain? Yes: NPRS scale: 2-3/10 Pain location: Right knee  Pain description: Achy  Aggravating factors: Weight bearing and walking  Relieving factors: Sitting still    OBJECTIVE: (objective measures completed at initial evaluation unless otherwise dated)  VITALS: BP 128/68 HR 65 SpO2 98   DIAGNOSTIC FINDINGS: No imaging listed for left hip in chart    PATIENT SURVEYS:  FOTO 63/100 with target of 71     COGNITION: Overall cognitive status: Within functional limits for tasks assessed                         SENSATION: WFL     MUSCLE LENGTH: Hamstrings: Right 60 deg; Left 60 deg Thomas test: Positive bilateral    POSTURE: No Significant postural limitations   PALPATION: Left lateral greater trochanter    LOWER EXTREMITY ROM:     Active  Right 10/21/2022 Left 10/21/2022  Hip flexion 120 120  Hip extension 30 30  Hip abduction 45 45  Hip adduction 30 30  Hip internal rotation 45 45  Hip external rotation 45 45  Knee flexion 135 135  Knee extension 0 0  Ankle dorsiflexion 20 20  Ankle plantarflexion 50 50  Ankle inversion 35 35  Ankle eversion 15 15   (Blank rows = not tested)        LOWER EXTREMITY MMT:   MMT Right eval Left eval  Hip flexion 5 5  Hip extension 4 4  Hip abduction 4 4  Hip adduction 4- 4-  Hip internal rotation      Hip external rotation      Knee flexion 5 5  Knee extension 5 5  Ankle dorsiflexion 5 5  Ankle plantarflexion      Ankle inversion      Ankle eversion       (Blank rows = not tested)   LOWER EXTREMITY SPECIAL TESTS:  Hip special tests: Saralyn Pilar (FABER) test: negative, Trendelenburg test: NT , Thomas test: positive , Ober's test: NT, Ely's test: positive , SI compression test: negative, and Hip scouring test: NT, FADIR: + on left, Hip ER Derotation test: Negative bilateral    FUNCTIONAL TESTS:  5 times sit to stand: NT  30 seconds chair stand test NT  6 minute walk test: NT  Squat: Increased forward flexion  Single Leg Stance: Bilateral Trendelenburg    GAIT: Distance walked: 50 ft  Assistive device utilized: None Level of assistance: Complete Independence Comments: No gait deficits noted      TODAY'S TREATMENT:  DATE:   11/10/22: Recumbent Bicycle with seat 10 and resistance 2 for 5 min    R/L side lying: min to mod multimodal cues for form/technique for specific targeted musculature. Good carryover after cues. 3# AW at distal femur for exercises.   Hip abduction: 3x8. Min to mod multimodal cuing for form/technique to engage glut med and reduced TFL engagement   Clam shells: 3x8  Hook lying:   Glut bridges progressed to blue TB, 3x12    SLR: 3x8/LE with 3# AW at distal femur.   Side steps along airex beam: x10/direction, SBA    PATIENT EDUCATION:  Education details: form and technique for appropriate exercise and explanation of differential diagnosis   Person educated: Patient Education method: Explanation, Demonstration, Verbal cues, and Handouts Education comprehension: verbalized understanding, returned demonstration, and verbal cues required   HOME EXERCISE PROGRAM: Access Code: [email or web address] URL: [email or web address] Date: 10/27/2022 Prepared by: Bradly Chris  Exercises - Prone Quadriceps Stretch with Strap  - 1 x daily - 3 reps - 60 sec hold - Modified Thomas Stretch  - 1 x daily - 3 reps - 60 sec hold - Lateral Lunge  - 3 x weekly - 3 sets - 10 reps - Mini Squat  - 3 x weekly - 3 sets - 10 reps - Standing Hip Abduction with Counter Support  - 3 x weekly - 3 sets - 10 reps - Side Step Down with Counter Support  - 3 x weekly - 3 sets - 10 reps - Forward Step Down  - 3 x weekly - 3 sets - 10 reps   ASSESSMENT:   CLINICAL IMPRESSION: Continuing PT POC with focus on L hip strengthening. Total removal of CKC exercises due to continuous onset of R knee pain. Use of resistance above knee joint to prevent aggravation of knee throughout session. Pt able to tolerate further increase in resistance indicating further improvement in L hip pain and strength. Pt making excellent progress towards L hip pain but limited by R knee pain. Pt will benefit from skilled PT to address the aforementioned deficits to resume aerobic exercise and to remain physical  active to maintain her independence and quality of life.            OBJECTIVE IMPAIRMENTS: decreased mobility, decreased ROM, decreased strength, impaired flexibility, and pain.    ACTIVITY LIMITATIONS: lifting, bending, standing, squatting, stairs, and transfers   PARTICIPATION LIMITATIONS: driving, shopping, community activity, and exercise    PERSONAL FACTORS: Past/current experiences and 1-2 comorbidities: h/o right hip pain, h/o basal cell carcinoma  are also affecting patient's functional outcome.    REHAB POTENTIAL: Good   CLINICAL DECISION MAKING: Stable/uncomplicated   EVALUATION COMPLEXITY: Low     GOALS: Goals reviewed with patient? No   SHORT TERM GOALS: Target date: 11/05/2022  Pt will be independent with HEP in order to improve strength and balance in order to decrease fall risk and improve function at home and work. Baseline:  Performing independently  Goal status: Ongoing        LONG TERM GOALS: Target date: 12/31/2022    Patient will have improved function and activity level as evidenced by an increase in FOTO score by 10 points or more.  Baseline: 63/100 with target of 71  Goal status: Ongoing    2.  Patient will improve hip strength by 1/3 grade MMT (4- to 4) for improved hip stability and potential decrease in hip pain.  Baseline: Hip Ext R/L 4/4, Hip aBd R/L 4/4,  Hip Add R/L 4-/4- Goal status: Ongoing    3.  Pt will ambulate for >=1000 ft in the 68mT without stopping for rest break as evidence of improved response to left hip pain and improved function.   Baseline: 1,250 ft  Goal status: Deferred          PLAN:   PT FREQUENCY: 1-2x/week   PT DURATION: 10 weeks   PLANNED INTERVENTIONS: Therapeutic exercises, Neuromuscular re-education, Balance training, Gait training, Joint mobilization, Joint manipulation, Stair training, DME instructions, Aquatic Therapy, Dry Needling, Spinal manipulation, Spinal mobilization, Cryotherapy, Moist heat, Traction,  Manual therapy, and Re-evaluation   PLAN FOR NEXT SESSION:   Progress hip strengthening and mobility exercises: TFL strengthening exercise in side lying, runner step up, OMEGA machines for hip strengthening.   MSalem Caster Fairly IV, PT, DPT Physical Therapist- [facility] 11/10/2022, 11:48 AM

## 2022-11-13 ENCOUNTER — Encounter: Payer: Self-pay | Admitting: Physical Therapy

## 2022-11-13 ENCOUNTER — Ambulatory Visit: Payer: Medicare PPO

## 2022-11-13 DIAGNOSIS — M25552 Pain in left hip: Secondary | ICD-10-CM | POA: Diagnosis not present

## 2022-11-13 NOTE — Therapy (Signed)
OUTPATIENT PHYSICAL THERAPY TREATMENT NOTE   Patient Name: Stephanie Davila MRN: [id number] DOB:November 19, 1941, 81 y.o., female Today's Date: 11/13/2022  PCP: Dr. Reynold Bowen  REFERRING PROVIDER: Tamala Julian PA-C   END OF SESSION:   PT End of Session - 11/13/22 1013     Visit Number 6    Number of Visits 20    Date for PT Re-Evaluation 12/30/22    Authorization Type [employer]    Authorization Time Period 10/21/22-12/30/22    Authorization - Number of Visits 20    Progress Note Due on Visit 10    PT Start Time 1015    PT Stop Time 1100    PT Time Calculation (min) 45 min    Activity Tolerance Patient tolerated treatment well    Behavior During Therapy WFL for tasks assessed/performed             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   Hematuria    negative uro eval X 2   Hepatitis 12/08/1974   Hyperlipidemia    Nephrolithiasis    Osteopenia    dexa 2008   Thyroid disease    Past Surgical History:  Procedure Laterality Date   ABDOMINAL HYSTERECTOMY  1996   BSO secondary to AUB and fibroids   BOTOX INJECTION N/A 03/31/2014   Procedure: BOTOX INJECTION;  Surgeon: Jeryl Columbia, MD;  Location: [facility];  Service: Endoscopy;  Laterality: N/A;   CATARACT EXTRACTION W/PHACO Left 09/25/2021   Procedure: CATARACT EXTRACTION PHACO AND INTRAOCULAR LENS PLACEMENT ([place]) LEFT 5.14 00:59.9;  Surgeon: Leandrew Koyanagi, MD;  Location: [place];  Service: Ophthalmology;  Laterality: Left;  wants to be as early as possible   ESOPHAGEAL MANOMETRY N/A 03/27/2014   Procedure: ESOPHAGEAL MANOMETRY (EM);  Surgeon: Jeryl Columbia, MD;  Location: [facility];  Service: Endoscopy;  Laterality: N/A;   ESOPHAGOGASTRODUODENOSCOPY N/A 03/31/2014   Procedure: ESOPHAGOGASTRODUODENOSCOPY  (EGD);  Surgeon: Jeryl Columbia, MD;  Location: [facility];  Service: Endoscopy;  Laterality: N/A;   ESOPHAGOGASTRODUODENOSCOPY ENDOSCOPY  summer 2013   Dr. Watt Climes   HELLER MYOTOMY  09/07/14   MOHS SURGERY  09/15/13   nose   THYROIDECTOMY, PARTIAL  1994   TONSILLECTOMY     Patient Active Problem List   Diagnosis Date Noted   Acquired trigger finger of right index finger 02/26/2018   Achalasia 09/07/2014   Odynophagia 02/15/2014   Regurgitation 02/15/2014   Unspecified hypothyroidism 09/20/2013   BCC (basal cell carcinoma), face 09/13/2013   Chest pain radiating to arm 05/25/2012    REFERRING DIAG: Trochanteric Bursitis L hip  THERAPY DIAG:  Pain in left hip  [facility] for Evaluation and Treatment   PERTINENT HISTORY: Started a a month and a half ago. She was started on meloxicam and it has really helped. She describes being an avid walker and she has been this way for a long time. She does not walk as far. Her left pain radiated down from greater trochanter to lateral left knee.    PRECAUTIONS: None   SUBJECTIVE:  SUBJECTIVE STATEMENT:  Pt reports R knee pain still. Primarily after periods of sitting. Has improved as she has gotten her day started. L hip pain currently 1-2/10 NPS.   PAIN:  Are you having pain? Yes: NPRS scale: 1-2/10 Pain location: L hip  Pain description: Achy  Aggravating factors: Weight bearing and walking  Relieving factors: Sitting still    OBJECTIVE: (objective measures completed at initial evaluation unless otherwise dated)  VITALS: BP 128/68 HR 65 SpO2 98   DIAGNOSTIC FINDINGS: No imaging listed for left hip in chart    PATIENT SURVEYS:  FOTO 63/100 with target of 71    COGNITION: Overall cognitive status: Within functional limits for tasks assessed                          SENSATION: WFL     MUSCLE LENGTH: Hamstrings: Right 60 deg; Left 60 deg Thomas test: Positive bilateral    POSTURE: No Significant postural limitations   PALPATION: Left lateral greater trochanter    LOWER EXTREMITY ROM:     Active  Right 10/21/2022 Left 10/21/2022  Hip flexion 120 120  Hip extension 30 30  Hip abduction 45 45  Hip adduction 30 30  Hip internal rotation 45 45  Hip external rotation 45 45  Knee flexion 135 135  Knee extension 0 0  Ankle dorsiflexion 20 20  Ankle plantarflexion 50 50  Ankle inversion 35 35  Ankle eversion 15 15   (Blank rows = not tested)        LOWER EXTREMITY MMT:   MMT Right eval Left eval  Hip flexion 5 5  Hip extension 4 4  Hip abduction 4 4  Hip adduction 4- 4-  Hip internal rotation      Hip external rotation      Knee flexion 5 5  Knee extension 5 5  Ankle dorsiflexion 5 5  Ankle plantarflexion      Ankle inversion      Ankle eversion       (Blank rows = not tested)   LOWER EXTREMITY SPECIAL TESTS:  Hip special tests: Saralyn Pilar (FABER) test: negative, Trendelenburg test: NT , Thomas test: positive , Ober's test: NT, Ely's test: positive , SI compression test: negative, and Hip scouring test: NT, FADIR: + on left, Hip ER Derotation test: Negative bilateral    FUNCTIONAL TESTS:  5 times sit to stand: NT  30 seconds chair stand test NT  6 minute walk test: NT  Squat: Increased forward flexion  Single Leg Stance: Bilateral Trendelenburg    GAIT: Distance walked: 50 ft  Assistive device utilized: None Level of assistance: Complete Independence Comments: No gait deficits noted      TODAY'S TREATMENT:                                                                                                                              DATE:  11/13/22: Recumbent Bicycle with seat 10 and resistance 4 for 5 min   R/L side lying: min to mod multimodal cues for form/technique for specific targeted  musculature. Good carryover after cues. 3# AW at distal femur for exercises.    Hip abduction: 3x8.Excellent carryover in form/technique compared to last session    Clam shells: 3x8, RTB instead of AW's  Hook lying:   Glut bridges progressed to blue TB, 3x12  SLR: 3x8/LE with 3# AW at ankle. Mod VC's for TKE throughout.   Side steps along airex beam: x10/direction, SBA  LLE SLS on airex pad: 3x30 sec/LE and SUE support     PATIENT EDUCATION:  Education details: form and technique for appropriate exercise and explanation of differential diagnosis   Person educated: Patient Education method: Explanation, Demonstration, Verbal cues, and Handouts Education comprehension: verbalized understanding, returned demonstration, and verbal cues required   HOME EXERCISE PROGRAM: Access Code: [email or web address] URL: [email or web address] Date: 10/27/2022 Prepared by: Bradly Chris  Exercises - Prone Quadriceps Stretch with Strap  - 1 x daily - 3 reps - 60 sec hold - Modified Thomas Stretch  - 1 x daily - 3 reps - 60 sec hold - Lateral Lunge  - 3 x weekly - 3 sets - 10 reps - Mini Squat  - 3 x weekly - 3 sets - 10 reps - Standing Hip Abduction with Counter Support  - 3 x weekly - 3 sets - 10 reps - Side Step Down with Counter Support  - 3 x weekly - 3 sets - 10 reps - Forward Step Down  - 3 x weekly - 3 sets - 10 reps   ASSESSMENT:   CLINICAL IMPRESSION: Maintaining POC with further progression of resistance exercise. Pt able to tolerate with improvements in L hip pain post session. Pt with excellent carryover in form/technique with [place] exercises. Pt remains limited in CKC exercise due to her R knee pain. Pt will benefit from skilled PT to address the aforementioned deficits to resume aerobic exercise and to remain physical active to maintain her independence and quality of life.    OBJECTIVE IMPAIRMENTS: decreased mobility, decreased ROM, decreased strength, impaired flexibility, and  pain.    ACTIVITY LIMITATIONS: lifting, bending, standing, squatting, stairs, and transfers   PARTICIPATION LIMITATIONS: driving, shopping, community activity, and exercise    PERSONAL FACTORS: Past/current experiences and 1-2 comorbidities: h/o right hip pain, h/o basal cell carcinoma  are also affecting patient's functional outcome.    REHAB POTENTIAL: Good   CLINICAL DECISION MAKING: Stable/uncomplicated   EVALUATION COMPLEXITY: Low     GOALS: Goals reviewed with patient? No   SHORT TERM GOALS: Target date: 11/05/2022  Pt will be independent with HEP in order to improve strength and balance in order to decrease fall risk and improve function at home and work. Baseline:  Performing independently  Goal status: Ongoing        LONG TERM GOALS: Target date: 12/31/2022    Patient will have improved function and activity level as evidenced by an increase in FOTO score by 10 points or more.  Baseline: 63/100 with target of 71  Goal status: Ongoing    2.  Patient will improve hip strength by 1/3 grade MMT (4- to 4) for improved hip stability and potential decrease in hip pain.  Baseline: Hip Ext R/L 4/4, Hip aBd R/L 4/4, Hip Add R/L 4-/4- Goal status: Ongoing    3.  Pt will ambulate for >=1000 ft in the 52mT without stopping for  rest break as evidence of improved response to left hip pain and improved function.   Baseline: 1,250 ft  Goal status: Deferred          PLAN:   PT FREQUENCY: 1-2x/week   PT DURATION: 10 weeks   PLANNED INTERVENTIONS: Therapeutic exercises, Neuromuscular re-education, Balance training, Gait training, Joint mobilization, Joint manipulation, Stair training, DME instructions, Aquatic Therapy, Dry Needling, Spinal manipulation, Spinal mobilization, Cryotherapy, Moist heat, Traction, Manual therapy, and Re-evaluation   PLAN FOR NEXT SESSION:   Progress hip strengthening and mobility exercises: TFL strengthening exercise in side lying, runner step up,  OMEGA machines for hip strengthening.   Salem Caster. Fairly IV, PT, DPT Physical Therapist- [facility]  11/13/2022, 11:04 AM

## 2022-11-18 ENCOUNTER — Encounter: Payer: Self-pay | Admitting: Physical Therapy

## 2022-11-18 ENCOUNTER — Ambulatory Visit: Payer: Medicare PPO

## 2022-11-18 DIAGNOSIS — M25552 Pain in left hip: Secondary | ICD-10-CM | POA: Diagnosis not present

## 2022-11-18 NOTE — Therapy (Signed)
OUTPATIENT PHYSICAL THERAPY TREATMENT NOTE   Patient Name: Stephanie Davila MRN: [id number] DOB:[birth year], 81 y.o., female Today's Date: 11/18/2022  PCP: Dr. Reynold Bowen  REFERRING PROVIDER: Tamala Julian PA-C   END OF SESSION:   PT End of Session - 11/18/22 1424     Visit Number 7    Number of Visits 20    Date for PT Re-Evaluation 12/30/22    Authorization Type [employer]    Authorization Time Period 10/21/22-12/30/22    Authorization - Visit Number 6    Authorization - Number of Visits 20    Progress Note Due on Visit 10    PT Start Time 4098    PT Stop Time 1500    PT Time Calculation (min) 45 min    Activity Tolerance Patient tolerated treatment well    Behavior During Therapy WFL for tasks assessed/performed             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   Hematuria    negative uro eval X 2   Hepatitis 12/08/1974   Hyperlipidemia    Nephrolithiasis    Osteopenia    dexa 2008   Thyroid disease    Past Surgical History:  Procedure Laterality Date   ABDOMINAL HYSTERECTOMY  1996   BSO secondary to AUB and fibroids   BOTOX INJECTION N/A 03/31/2014   Procedure: BOTOX INJECTION;  Surgeon: Jeryl Columbia, MD;  Location: [facility];  Service: Endoscopy;  Laterality: N/A;   CATARACT EXTRACTION W/PHACO Left 09/25/2021   Procedure: CATARACT EXTRACTION PHACO AND INTRAOCULAR LENS PLACEMENT ([place]) LEFT 5.14 00:59.9;  Surgeon: Leandrew Koyanagi, MD;  Location: [place];  Service: Ophthalmology;  Laterality: Left;  wants to be as early as possible   ESOPHAGEAL MANOMETRY N/A 03/27/2014   Procedure: ESOPHAGEAL MANOMETRY (EM);  Surgeon: Jeryl Columbia, MD;  Location: [facility];  Service: Endoscopy;  Laterality: N/A;   ESOPHAGOGASTRODUODENOSCOPY N/A 03/31/2014    Procedure: ESOPHAGOGASTRODUODENOSCOPY (EGD);  Surgeon: Jeryl Columbia, MD;  Location: [facility];  Service: Endoscopy;  Laterality: N/A;   ESOPHAGOGASTRODUODENOSCOPY ENDOSCOPY  summer 2013   Dr. Watt Climes   HELLER MYOTOMY  09/07/14   MOHS SURGERY  09/15/13   nose   THYROIDECTOMY, PARTIAL  1994   TONSILLECTOMY     Patient Active Problem List   Diagnosis Date Noted   Acquired trigger finger of right index finger 02/26/2018   Achalasia 09/07/2014   Odynophagia 02/15/2014   Regurgitation 02/15/2014   Unspecified hypothyroidism 09/20/2013   BCC (basal cell carcinoma), face 09/13/2013   Chest pain radiating to arm 05/25/2012    REFERRING DIAG: Trochanteric Bursitis L hip  THERAPY DIAG:  Pain in left hip  [facility] for Evaluation and Treatment   PERTINENT HISTORY: Started a a month and a half ago. She was started on meloxicam and it has really helped. She describes being an avid walker and she has been this way for a long time. She does not walk as far. Her left pain radiated down from greater trochanter to lateral left knee.    PRECAUTIONS: None   SUBJECTIVE:  SUBJECTIVE STATEMENT:  Pt reports R knee pain minorly. L hip remains painless. Some intermittent, minor soreness from previous sessions.  PAIN:  Are you having pain? Yes: NPRS scale: 0/10 Pain location: L hip  Pain description: Achy  Aggravating factors: Weight bearing and walking  Relieving factors: Sitting still    OBJECTIVE: (objective measures completed at initial evaluation unless otherwise dated)  VITALS: BP 128/68 HR 65 SpO2 98   DIAGNOSTIC FINDINGS: No imaging listed for left hip in chart    PATIENT SURVEYS:  FOTO 63/100 with target of 71    COGNITION: Overall cognitive status: Within functional limits for tasks assessed                          SENSATION: WFL     MUSCLE LENGTH: Hamstrings: Right 60 deg; Left 60 deg Thomas test: Positive bilateral    POSTURE: No Significant postural limitations   PALPATION: Left lateral greater trochanter    LOWER EXTREMITY ROM:     Active  Right 10/21/2022 Left 10/21/2022  Hip flexion 120 120  Hip extension 30 30  Hip abduction 45 45  Hip adduction 30 30  Hip internal rotation 45 45  Hip external rotation 45 45  Knee flexion 135 135  Knee extension 0 0  Ankle dorsiflexion 20 20  Ankle plantarflexion 50 50  Ankle inversion 35 35  Ankle eversion 15 15   (Blank rows = not tested)        LOWER EXTREMITY MMT:   MMT Right eval Left eval  Hip flexion 5 5  Hip extension 4 4  Hip abduction 4 4  Hip adduction 4- 4-  Hip internal rotation      Hip external rotation      Knee flexion 5 5  Knee extension 5 5  Ankle dorsiflexion 5 5  Ankle plantarflexion      Ankle inversion      Ankle eversion       (Blank rows = not tested)   LOWER EXTREMITY SPECIAL TESTS:  Hip special tests: Saralyn Pilar (FABER) test: negative, Trendelenburg test: NT , Thomas test: positive , Ober's test: NT, Ely's test: positive , SI compression test: negative, and Hip scouring test: NT, FADIR: + on left, Hip ER Derotation test: Negative bilateral    FUNCTIONAL TESTS:  5 times sit to stand: NT  30 seconds chair stand test NT  6 minute walk test: NT  Squat: Increased forward flexion  Single Leg Stance: Bilateral Trendelenburg    GAIT: Distance walked: 50 ft  Assistive device utilized: None Level of assistance: Complete Independence Comments: No gait deficits noted      TODAY'S TREATMENT:                                                                                                                              DATE:   11/18/22: Recumbent Bicycle with seat 10 and resistance 4  for 5 min   R/L side lying: min to mod multimodal cues for form/technique for specific targeted  musculature. Good carryover after cues. 4# AW at distal femur for exercises.    Hip abduction: 3x8. Excellent carryover in form/technique compared to last session    Clam shells: 3x8, RTB instead of AW's   Side steps along airex beam: x10/direction, SBA  LLE SLS on airex pad: 3x30 sec/LE and SUE support    Resisted side steps with RTB: 2x10 meters.    Backwards gait: 4 x 10 meters for glut max strength    Side steps on airex beam: x20/side    PATIENT EDUCATION:  Education details: form and technique for appropriate exercise and explanation of differential diagnosis   Person educated: Patient Education method: Explanation, Demonstration, Verbal cues, and Handouts Education comprehension: verbalized understanding, returned demonstration, and verbal cues required   HOME EXERCISE PROGRAM: Access Code: [email or web address] URL: [email or web address] Date: 10/27/2022 Prepared by: Bradly Chris  Exercises - Prone Quadriceps Stretch with Strap  - 1 x daily - 3 reps - 60 sec hold - Modified Thomas Stretch  - 1 x daily - 3 reps - 60 sec hold - Lateral Lunge  - 3 x weekly - 3 sets - 10 reps - Mini Squat  - 3 x weekly - 3 sets - 10 reps - Standing Hip Abduction with Counter Support  - 3 x weekly - 3 sets - 10 reps - Side Step Down with Counter Support  - 3 x weekly - 3 sets - 10 reps - Forward Step Down  - 3 x weekly - 3 sets - 10 reps   ASSESSMENT:   CLINICAL IMPRESSION: Continuing PT POC with focus on glut med and hip rotator strengthening. Pt able to tolerate more standing and WB tasks this date without aggravation of R knee pain. Will continue to progress WB strengthening gently in hopes to limit R knee aggravation. Pt will benefit from skilled PT to address the aforementioned deficits to resume aerobic exercise and to remain physical active to maintain her independence and quality of life.    OBJECTIVE IMPAIRMENTS: decreased mobility, decreased ROM, decreased strength,  impaired flexibility, and pain.    ACTIVITY LIMITATIONS: lifting, bending, standing, squatting, stairs, and transfers   PARTICIPATION LIMITATIONS: driving, shopping, community activity, and exercise    PERSONAL FACTORS: Past/current experiences and 1-2 comorbidities: h/o right hip pain, h/o basal cell carcinoma  are also affecting patient's functional outcome.    REHAB POTENTIAL: Good   CLINICAL DECISION MAKING: Stable/uncomplicated   EVALUATION COMPLEXITY: Low     GOALS: Goals reviewed with patient? No   SHORT TERM GOALS: Target date: 11/05/2022  Pt will be independent with HEP in order to improve strength and balance in order to decrease fall risk and improve function at home and work. Baseline:  Performing independently  Goal status: Ongoing        LONG TERM GOALS: Target date: 12/31/2022    Patient will have improved function and activity level as evidenced by an increase in FOTO score by 10 points or more.  Baseline: 63/100 with target of 71  Goal status: Ongoing    2.  Patient will improve hip strength by 1/3 grade MMT (4- to 4) for improved hip stability and potential decrease in hip pain.  Baseline: Hip Ext R/L 4/4, Hip aBd R/L 4/4, Hip Add R/L 4-/4- Goal status: Ongoing    3.  Pt will ambulate for >=1000 ft in the 30mT without stopping for  rest break as evidence of improved response to left hip pain and improved function.   Baseline: 1,250 ft  Goal status: Deferred          PLAN:   PT FREQUENCY: 1-2x/week   PT DURATION: 10 weeks   PLANNED INTERVENTIONS: Therapeutic exercises, Neuromuscular re-education, Balance training, Gait training, Joint mobilization, Joint manipulation, Stair training, DME instructions, Aquatic Therapy, Dry Needling, Spinal manipulation, Spinal mobilization, Cryotherapy, Moist heat, Traction, Manual therapy, and Re-evaluation   PLAN FOR NEXT SESSION:   Progress hip strengthening and mobility exercises: TFL strengthening exercise in side  lying, runner step up, OMEGA machines for hip strengthening.   Salem Caster. Fairly IV, PT, DPT Physical Therapist- [facility]  11/18/2022, 3:56 PM

## 2022-11-20 ENCOUNTER — Ambulatory Visit: Payer: Medicare PPO

## 2022-11-20 ENCOUNTER — Encounter: Payer: Self-pay | Admitting: Physical Therapy

## 2022-11-20 DIAGNOSIS — M25552 Pain in left hip: Secondary | ICD-10-CM | POA: Diagnosis not present

## 2022-11-20 NOTE — Therapy (Signed)
OUTPATIENT PHYSICAL THERAPY TREATMENT NOTE   Patient Name: Stephanie Davila MRN: [id number] DOB:[birth year], 81 y.o., female Today's Date: 11/20/2022  PCP: Dr. Reynold Bowen  REFERRING PROVIDER: Tamala Julian PA-C   END OF SESSION:   PT End of Session - 11/20/22 1335     Visit Number 8    Number of Visits 20    Date for PT Re-Evaluation 12/30/22    Authorization Type [employer]    Authorization Time Period 10/21/22-12/30/22    Authorization - Visit Number 8    Authorization - Number of Visits 20    Progress Note Due on Visit 10    PT Start Time 1332    PT Stop Time 1415    PT Time Calculation (min) 43 min    Activity Tolerance Patient tolerated treatment well    Behavior During Therapy WFL for tasks assessed/performed             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   Hematuria    negative uro eval X 2   Hepatitis 12/08/1974   Hyperlipidemia    Nephrolithiasis    Osteopenia    dexa 2008   Thyroid disease    Past Surgical History:  Procedure Laterality Date   ABDOMINAL HYSTERECTOMY  1996   BSO secondary to AUB and fibroids   BOTOX INJECTION N/A 03/31/2014   Procedure: BOTOX INJECTION;  Surgeon: Jeryl Columbia, MD;  Location: [facility];  Service: Endoscopy;  Laterality: N/A;   CATARACT EXTRACTION W/PHACO Left 09/25/2021   Procedure: CATARACT EXTRACTION PHACO AND INTRAOCULAR LENS PLACEMENT ([place]) LEFT 5.14 00:59.9;  Surgeon: Leandrew Koyanagi, MD;  Location: [place];  Service: Ophthalmology;  Laterality: Left;  wants to be as early as possible   ESOPHAGEAL MANOMETRY N/A 03/27/2014   Procedure: ESOPHAGEAL MANOMETRY (EM);  Surgeon: Jeryl Columbia, MD;  Location: [facility];  Service: Endoscopy;  Laterality: N/A;   ESOPHAGOGASTRODUODENOSCOPY N/A 03/31/2014    Procedure: ESOPHAGOGASTRODUODENOSCOPY (EGD);  Surgeon: Jeryl Columbia, MD;  Location: [facility];  Service: Endoscopy;  Laterality: N/A;   ESOPHAGOGASTRODUODENOSCOPY ENDOSCOPY  summer 2013   Dr. Watt Climes   HELLER MYOTOMY  09/07/14   MOHS SURGERY  09/15/13   nose   THYROIDECTOMY, PARTIAL  1994   TONSILLECTOMY     Patient Active Problem List   Diagnosis Date Noted   Acquired trigger finger of right index finger 02/26/2018   Achalasia 09/07/2014   Odynophagia 02/15/2014   Regurgitation 02/15/2014   Unspecified hypothyroidism 09/20/2013   BCC (basal cell carcinoma), face 09/13/2013   Chest pain radiating to arm 05/25/2012    REFERRING DIAG: Trochanteric Bursitis L hip  THERAPY DIAG:  Pain in left hip  [facility] for Evaluation and Treatment   PERTINENT HISTORY: Started a a month and a half ago. She was started on meloxicam and it has really helped. She describes being an avid walker and she has been this way for a long time. She does not walk as far. Her left pain radiated down from greater trochanter to lateral left knee.    PRECAUTIONS: None   SUBJECTIVE:  SUBJECTIVE STATEMENT:  Pt reports R knee pain 2-3/10 NPS. Remains consistent with HEP. Denies L hip pain.  PAIN:  Are you having pain? Yes: NPRS scale: 0/10 Pain location: L hip  Pain description: Achy  Aggravating factors: Weight bearing and walking  Relieving factors: Sitting still    OBJECTIVE: (objective measures completed at initial evaluation unless otherwise dated)  VITALS: BP 128/68 HR 65 SpO2 98   DIAGNOSTIC FINDINGS: No imaging listed for left hip in chart    PATIENT SURVEYS:  FOTO 63/100 with target of 71    COGNITION: Overall cognitive status: Within functional limits for tasks assessed                          SENSATION: WFL     MUSCLE LENGTH: Hamstrings: Right 60 deg; Left 60 deg Thomas test: Positive bilateral    POSTURE: No Significant postural limitations   PALPATION: Left lateral greater trochanter    LOWER EXTREMITY ROM:     Active  Right 10/21/2022 Left 10/21/2022  Hip flexion 120 120  Hip extension 30 30  Hip abduction 45 45  Hip adduction 30 30  Hip internal rotation 45 45  Hip external rotation 45 45  Knee flexion 135 135  Knee extension 0 0  Ankle dorsiflexion 20 20  Ankle plantarflexion 50 50  Ankle inversion 35 35  Ankle eversion 15 15   (Blank rows = not tested)        LOWER EXTREMITY MMT:   MMT Right eval Left eval  Hip flexion 5 5  Hip extension 4 4  Hip abduction 4 4  Hip adduction 4- 4-  Hip internal rotation      Hip external rotation      Knee flexion 5 5  Knee extension 5 5  Ankle dorsiflexion 5 5  Ankle plantarflexion      Ankle inversion      Ankle eversion       (Blank rows = not tested)   LOWER EXTREMITY SPECIAL TESTS:  Hip special tests: Saralyn Pilar (FABER) test: negative, Trendelenburg test: NT , Thomas test: positive , Ober's test: NT, Ely's test: positive , SI compression test: negative, and Hip scouring test: NT, FADIR: + on left, Hip ER Derotation test: Negative bilateral    FUNCTIONAL TESTS:  5 times sit to stand: NT  30 seconds chair stand test NT  6 minute walk test: NT  Squat: Increased forward flexion  Single Leg Stance: Bilateral Trendelenburg    GAIT: Distance walked: 50 ft  Assistive device utilized: None Level of assistance: Complete Independence Comments: No gait deficits noted      TODAY'S TREATMENT:                                                                                                                              DATE:   11/20/22: Recumbent Bicycle with seat 10 and resistance 5 for 5  min   R/L side lying: min to mod multimodal cues for form/technique for specific targeted musculature. Good  carryover after cues. 4# AW at distal femur for exercises.    Hip abduction: 3x8    Clam shells: 3x8, blue TB instead of AW's  BLE SLS rebounder toss: 2 KG med ball toss with contralateral toe touch for support. CGA. 2x10/LE  RTB resisted side steps: 4 x 20'   Bent over prone extension: 2x10/LE with 2# AW's.    PATIENT EDUCATION:  Education details: form and technique for appropriate exercise and explanation of differential diagnosis   Person educated: Patient Education method: Explanation, Demonstration, Verbal cues, and Handouts Education comprehension: verbalized understanding, returned demonstration, and verbal cues required   HOME EXERCISE PROGRAM: Access Code: [email or web address] URL: [email or web address] Date: 10/27/2022 Prepared by: Bradly Chris  Exercises - Prone Quadriceps Stretch with Strap  - 1 x daily - 3 reps - 60 sec hold - Modified Thomas Stretch  - 1 x daily - 3 reps - 60 sec hold - Lateral Lunge  - 3 x weekly - 3 sets - 10 reps - Mini Squat  - 3 x weekly - 3 sets - 10 reps - Standing Hip Abduction with Counter Support  - 3 x weekly - 3 sets - 10 reps - Side Step Down with Counter Support  - 3 x weekly - 3 sets - 10 reps - Forward Step Down  - 3 x weekly - 3 sets - 10 reps   ASSESSMENT:   CLINICAL IMPRESSION: Pt tolerating progression of CKC therex without exacerbation of R knee pain. Pt reporting reduced R knee pain in the last couple sessions with consistent addition of CKC exercise. Still avoiding provocative positions however like knee flexion in CKC. Pt tolerating increased resistance and hip stability exercises without worsening of symptoms. Pt approaching 10th visit warranting progress note. Anticipate pt may be  discharge appropriate by 10th visit as pt no longer having L hip pain and progressing quickly in tolerance for resistance exercise and standing/walking tasks. Pt will benefit from skilled PT to address the aforementioned deficits to resume  aerobic exercise and to remain physical active to maintain her independence and quality of life.    OBJECTIVE IMPAIRMENTS: decreased mobility, decreased ROM, decreased strength, impaired flexibility, and pain.    ACTIVITY LIMITATIONS: lifting, bending, standing, squatting, stairs, and transfers   PARTICIPATION LIMITATIONS: driving, shopping, community activity, and exercise    PERSONAL FACTORS: Past/current experiences and 1-2 comorbidities: h/o right hip pain, h/o basal cell carcinoma  are also affecting patient's functional outcome.    REHAB POTENTIAL: Good   CLINICAL DECISION MAKING: Stable/uncomplicated   EVALUATION COMPLEXITY: Low     GOALS: Goals reviewed with patient? No   SHORT TERM GOALS: Target date: 11/05/2022  Pt will be independent with HEP in order to improve strength and balance in order to decrease fall risk and improve function at home and work. Baseline:  Performing independently  Goal status: Ongoing        LONG TERM GOALS: Target date: 12/31/2022    Patient will have improved function and activity level as evidenced by an increase in FOTO score by 10 points or more.  Baseline: 63/100 with target of 71  Goal status: Ongoing    2.  Patient will improve hip strength by 1/3 grade MMT (4- to 4) for improved hip stability and potential decrease in hip pain.  Baseline: Hip Ext R/L 4/4, Hip aBd R/L 4/4, Hip Add R/L 4-/4- Goal  status: Ongoing    3.  Pt will ambulate for >=1000 ft in the 42mT without stopping for rest break as evidence of improved response to left hip pain and improved function.   Baseline: 1,250 ft  Goal status: Deferred          PLAN:   PT FREQUENCY: 1-2x/week   PT DURATION: 10 weeks   PLANNED INTERVENTIONS: Therapeutic exercises, Neuromuscular re-education, Balance training, Gait training, Joint mobilization, Joint manipulation, Stair training, DME instructions, Aquatic Therapy, Dry Needling, Spinal manipulation, Spinal mobilization,  Cryotherapy, Moist heat, Traction, Manual therapy, and Re-evaluation   PLAN FOR NEXT SESSION:   Progress hip strengthening and mobility exercises: TFL strengthening exercise in side lying, runner step up, OMEGA machines for hip strengthening.   MSalem Caster Fairly IV, PT, DPT Physical Therapist- [facility] 11/20/2022, 2:28 PM

## 2022-11-24 ENCOUNTER — Ambulatory Visit: Payer: Medicare PPO | Admitting: Physical Therapy

## 2022-11-24 DIAGNOSIS — M25552 Pain in left hip: Secondary | ICD-10-CM

## 2022-11-24 NOTE — Therapy (Signed)
OUTPATIENT PHYSICAL THERAPY TREATMENT NOTE   Patient Name: Stephanie Davila MRN: [id number] DOB:[birth year], 81 y.o., female Today's Date: 11/24/2022  PCP: Dr. 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   Hematuria    negative uro eval X 2   Hepatitis 12/08/1974   Hyperlipidemia    Nephrolithiasis    Osteopenia    dexa 2008   Thyroid disease    Past Surgical History:  Procedure Laterality Date   ABDOMINAL HYSTERECTOMY  1996   BSO secondary to AUB and fibroids   BOTOX INJECTION N/A 03/31/2014   Procedure: BOTOX INJECTION;  Surgeon: Jeryl Columbia, MD;  [facility]: [facility];  Service: Endoscopy;  Laterality: N/A;   CATARACT EXTRACTION W/PHACO Left 09/25/2021   Procedure: CATARACT EXTRACTION PHACO AND INTRAOCULAR LENS PLACEMENT ([place]) LEFT 5.14 00:59.9;  Surgeon: Leandrew Koyanagi, MD;  Location: [facility];  Service: Ophthalmology;  Laterality: Left;  wants to be as early as possible   ESOPHAGEAL MANOMETRY N/A 03/27/2014   Procedure: ESOPHAGEAL MANOMETRY (EM);  Surgeon: Jeryl Columbia, MD;  [facility];  Service: Endoscopy;  Laterality: N/A;   ESOPHAGOGASTRODUODENOSCOPY N/A 03/31/2014   Procedure: ESOPHAGOGASTRODUODENOSCOPY (EGD);  Surgeon: Jeryl Columbia, MD;  Location: [facility];  Service: Endoscopy;  Laterality: N/A;   ESOPHAGOGASTRODUODENOSCOPY ENDOSCOPY  summer 2013   Dr. Watt Climes   HELLER MYOTOMY  09/07/14   MOHS SURGERY  09/15/13   nose   THYROIDECTOMY, PARTIAL  1994   TONSILLECTOMY     Patient Active Problem List   Diagnosis Date Noted   Acquired trigger finger of right index finger 02/26/2018   Achalasia 09/07/2014   Odynophagia 02/15/2014   Regurgitation 02/15/2014   Unspecified  hypothyroidism 09/20/2013   BCC (basal cell carcinoma), face 09/13/2013   Chest pain radiating to arm 05/25/2012    REFERRING DIAG: Trochanteric Bursitis L hip  THERAPY DIAG:  No diagnosis found.  [facility] for Evaluation and Treatment   PERTINENT HISTORY: Started a a month and a half ago. She was started on meloxicam and it has really helped. She describes being an avid walker and she has been this way for a long time. She does not walk as far. Her left pain radiated down from greater trochanter to lateral left knee.    PRECAUTIONS: None   SUBJECTIVE:  SUBJECTIVE STATEMENT:  Continues to reports R knee pain 2-3/10 NPS and no left knee pain. Remains consistent with HEP. She reports that she wants to discontinue PT because she is no longer having left hip pain and she wants to rest  PAIN:  Are you having pain? Yes: NPRS scale: 0/10 Pain location: L hip  Pain description: Achy  Aggravating factors: Weight bearing and walking  Relieving factors: Sitting still    OBJECTIVE: (objective measures completed at initial evaluation unless otherwise dated)  VITALS: BP 128/68 HR 65 SpO2 98   DIAGNOSTIC FINDINGS: No imaging listed for left hip in chart    PATIENT SURVEYS:  FOTO 63/100 with target of 71    COGNITION: Overall cognitive status: Within functional limits for tasks assessed                         SENSATION: WFL     MUSCLE LENGTH: Hamstrings: Right 60 deg; Left 60 deg Thomas test: Positive bilateral    POSTURE: No Significant postural limitations   PALPATION: Left lateral greater trochanter    LOWER EXTREMITY ROM:     Active  Right 10/21/2022 Left 10/21/2022  Hip flexion 120 120  Hip extension 30 30  Hip abduction 45 45  Hip adduction 30 30  Hip internal rotation 45  45  Hip external rotation 45 45  Knee flexion 135 135  Knee extension 0 0  Ankle dorsiflexion 20 20  Ankle plantarflexion 50 50  Ankle inversion 35 35  Ankle eversion 15 15   (Blank rows = not tested)        LOWER EXTREMITY MMT:   MMT Right eval Left eval Right  11/24/22 Left  11/24/22  Hip flexion 5 5    Hip extension 4 4 4+ 4  Hip abduction 4 4 4+ 4+  Hip adduction 4- 4- 4 4  Hip internal rotation        Hip external rotation        Knee flexion 5 5    Knee extension 5 5    Ankle dorsiflexion 5 5    Ankle plantarflexion        Ankle inversion        Ankle eversion         (Blank rows = not tested)   LOWER EXTREMITY SPECIAL TESTS:  Hip special tests: Saralyn Pilar (FABER) test: negative, Trendelenburg test: NT , Thomas test: positive , Ober's test: NT, Ely's test: positive , SI compression test: negative, and Hip scouring test: NT, FADIR: + on left, Hip ER Derotation test: Negative bilateral    FUNCTIONAL TESTS:  5 times sit to stand: NT  30 seconds chair stand test NT  6 minute walk test: NT  Squat: Increased forward flexion  Single Leg Stance: Bilateral Trendelenburg    GAIT: Distance walked: 50 ft  Assistive device utilized: None Level of assistance: Complete Independence Comments: No gait deficits noted      TODAY'S TREATMENT:  DATE:   11/24/22: Recumbent Bicycle with seat 10 and resistance 4 for 5 min  Reviewed HEP and progressions with patients.  -Standing hip abduction with increased resistance. FOTO: 65/100 with target of 73   11/20/22: Recumbent Bicycle with seat 10 and resistance 5 for 5 min   R/L side lying: min to mod multimodal cues for form/technique for specific targeted musculature. Good carryover after cues. 4# AW at distal femur for exercises.    Hip abduction: 3x8    Clam shells: 3x8, blue TB instead of AW's  BLE  SLS rebounder toss: 2 KG med ball toss with contralateral toe touch for support. CGA. 2x10/LE  RTB resisted side steps: 4 x 20'   Bent over prone extension: 2x10/LE with 2# AW's.    PATIENT EDUCATION:  Education details: form and technique for appropriate exercise and explanation of differential diagnosis   Person educated: Patient Education method: Explanation, Demonstration, Verbal cues, and Handouts Education comprehension: verbalized understanding, returned demonstration, and verbal cues required   HOME EXERCISE PROGRAM: Access Code: [email or web address] URL: [email or web address] Date: 11/24/2022 Prepared by: Bradly Chris  Exercises - Prone Quadriceps Stretch with Strap  - 1 x daily - 3 reps - 60 sec hold - Modified Thomas Stretch  - 1 x daily - 3 reps - 60 sec hold - Lateral Lunge  - 3 x weekly - 3 sets - 10 reps - Mini Squat  - 3 x weekly - 3 sets - 10 reps - Standing Hip Abduction with Counter Support  - 3 x weekly - 3 sets - 10 reps - Side Step Down with Counter Support  - 3 x weekly - 3 sets - 10 reps   ASSESSMENT:   CLINICAL IMPRESSION:  Pt has nearly met all of her PT goals with an improvement in her hip strength and self-perception of her function. She is still limited with left hip extension, and she will continue with HEP to improve hip extension strength. She will return to orthopedist for further eval of right knee which appears to be arthritic in presentation with pain and stiffness after prolonged immobility. Pt is ready for discharge and she has been given HEP to continue to maintain strength and functional gains independently.   OBJECTIVE IMPAIRMENTS: decreased mobility, decreased ROM, decreased strength, impaired flexibility, and pain.    ACTIVITY LIMITATIONS: lifting, bending, standing, squatting, stairs, and transfers   PARTICIPATION LIMITATIONS: driving, shopping, community activity, and exercise    PERSONAL FACTORS: Past/current experiences and 1-2  comorbidities: h/o right hip pain, h/o basal cell carcinoma  are also affecting patient's functional outcome.    REHAB POTENTIAL: Good   CLINICAL DECISION MAKING: Stable/uncomplicated   EVALUATION COMPLEXITY: Low     GOALS: Goals reviewed with patient? No   SHORT TERM GOALS: Target date: 11/05/2022  Pt will be independent with HEP in order to improve strength and balance in order to decrease fall risk and improve function at home and work. Baseline:  Performing independently  Goal status: Achieved        LONG TERM GOALS: Target date: 12/31/2022    Patient will have improved function and activity level as evidenced by an increase in FOTO score by 10 points or more.  Baseline: 63/100 with target of 71 11/24/22: 65  Goal status: Partially met    2.  Patient will improve hip strength by 1/3 grade MMT (4- to 4) for improved hip stability and potential decrease in hip pain.  Baseline: Hip Ext R/L 4/4, Hip aBd R/L  4/4, Hip Add R/L 4-/4-  11/24/22: Hip Ext R/L 5/4, Hip aBd R/L 4+/4+, Hip Add R/L 4/4  Goal status: Partially met    3.  Pt will ambulate for >=1000 ft in the 64mT without stopping for rest break as evidence of improved response to left hip pain and improved function.   Baseline: 1,250 ft  Goal status: Deferred          PLAN:   PT FREQUENCY: 1-2x/week   PT DURATION: 10 weeks   PLANNED INTERVENTIONS: Therapeutic exercises, Neuromuscular re-education, Balance training, Gait training, Joint mobilization, Joint manipulation, Stair training, DME instructions, Aquatic Therapy, Dry Needling, Spinal manipulation, Spinal mobilization, Cryotherapy, Moist heat, Traction, Manual therapy, and Re-evaluation   [place] NEXT S[place]: [place]  [facility]rap[facility] 11/24/2022, 1:31 PM

## 2022-11-26 ENCOUNTER — Encounter: Payer: Medicare PPO | Admitting: Physical Therapy

## 2022-12-02 ENCOUNTER — Ambulatory Visit: Payer: Medicare PPO | Admitting: Physical Therapy

## 2022-12-04 ENCOUNTER — Ambulatory Visit: Payer: Medicare PPO | Admitting: Physical Therapy

## 2022-12-11 DIAGNOSIS — L820 Inflamed seborrheic keratosis: Secondary | ICD-10-CM | POA: Diagnosis not present

## 2022-12-11 DIAGNOSIS — R208 Other disturbances of skin sensation: Secondary | ICD-10-CM | POA: Diagnosis not present

## 2023-03-13 DIAGNOSIS — E785 Hyperlipidemia, unspecified: Secondary | ICD-10-CM | POA: Diagnosis not present

## 2023-03-13 DIAGNOSIS — M8580 Other specified disorders of bone density and structure, unspecified site: Secondary | ICD-10-CM | POA: Diagnosis not present

## 2023-03-13 DIAGNOSIS — K219 Gastro-esophageal reflux disease without esophagitis: Secondary | ICD-10-CM | POA: Diagnosis not present

## 2023-03-13 DIAGNOSIS — R7301 Impaired fasting glucose: Secondary | ICD-10-CM | POA: Diagnosis not present

## 2023-03-13 DIAGNOSIS — R7989 Other specified abnormal findings of blood chemistry: Secondary | ICD-10-CM | POA: Diagnosis not present

## 2023-03-13 DIAGNOSIS — I2510 Atherosclerotic heart disease of native coronary artery without angina pectoris: Secondary | ICD-10-CM | POA: Diagnosis not present

## 2023-03-13 DIAGNOSIS — E039 Hypothyroidism, unspecified: Secondary | ICD-10-CM | POA: Diagnosis not present

## 2023-03-24 DIAGNOSIS — E039 Hypothyroidism, unspecified: Secondary | ICD-10-CM | POA: Diagnosis not present

## 2023-03-24 DIAGNOSIS — M7062 Trochanteric bursitis, left hip: Secondary | ICD-10-CM | POA: Diagnosis not present

## 2023-03-24 DIAGNOSIS — K219 Gastro-esophageal reflux disease without esophagitis: Secondary | ICD-10-CM | POA: Diagnosis not present

## 2023-03-24 DIAGNOSIS — E785 Hyperlipidemia, unspecified: Secondary | ICD-10-CM | POA: Diagnosis not present

## 2023-03-24 DIAGNOSIS — I2510 Atherosclerotic heart disease of native coronary artery without angina pectoris: Secondary | ICD-10-CM | POA: Diagnosis not present

## 2023-03-24 DIAGNOSIS — E042 Nontoxic multinodular goiter: Secondary | ICD-10-CM | POA: Diagnosis not present

## 2023-03-24 DIAGNOSIS — R7301 Impaired fasting glucose: Secondary | ICD-10-CM | POA: Diagnosis not present

## 2023-03-24 DIAGNOSIS — Z0000 Encounter for general adult medical examination without abnormal findings: Secondary | ICD-10-CM | POA: Diagnosis not present

## 2023-03-24 DIAGNOSIS — Z1331 Encounter for screening for depression: Secondary | ICD-10-CM | POA: Diagnosis not present

## 2023-03-24 DIAGNOSIS — Z1339 Encounter for screening examination for other mental health and behavioral disorders: Secondary | ICD-10-CM | POA: Diagnosis not present

## 2023-03-24 DIAGNOSIS — Z23 Encounter for immunization: Secondary | ICD-10-CM | POA: Diagnosis not present

## 2023-03-24 DIAGNOSIS — R82998 Other abnormal findings in urine: Secondary | ICD-10-CM | POA: Diagnosis not present

## 2023-04-30 DIAGNOSIS — H2511 Age-related nuclear cataract, right eye: Secondary | ICD-10-CM | POA: Diagnosis not present

## 2023-04-30 DIAGNOSIS — H353132 Nonexudative age-related macular degeneration, bilateral, intermediate dry stage: Secondary | ICD-10-CM | POA: Diagnosis not present

## 2023-04-30 DIAGNOSIS — Z961 Presence of intraocular lens: Secondary | ICD-10-CM | POA: Diagnosis not present

## 2023-04-30 DIAGNOSIS — Z0100 Encounter for examination of eyes and vision without abnormal findings: Secondary | ICD-10-CM | POA: Diagnosis not present

## 2023-05-27 DIAGNOSIS — Z1231 Encounter for screening mammogram for malignant neoplasm of breast: Secondary | ICD-10-CM | POA: Diagnosis not present

## 2023-07-22 DIAGNOSIS — H6123 Impacted cerumen, bilateral: Secondary | ICD-10-CM | POA: Diagnosis not present

## 2023-07-22 DIAGNOSIS — H90A22 Sensorineural hearing loss, unilateral, left ear, with restricted hearing on the contralateral side: Secondary | ICD-10-CM | POA: Diagnosis not present

## 2023-07-22 DIAGNOSIS — H903 Sensorineural hearing loss, bilateral: Secondary | ICD-10-CM | POA: Diagnosis not present

## 2023-07-22 DIAGNOSIS — H9319 Tinnitus, unspecified ear: Secondary | ICD-10-CM | POA: Diagnosis not present

## 2023-08-05 DIAGNOSIS — H9312 Tinnitus, left ear: Secondary | ICD-10-CM | POA: Diagnosis not present

## 2023-08-05 DIAGNOSIS — H90A22 Sensorineural hearing loss, unilateral, left ear, with restricted hearing on the contralateral side: Secondary | ICD-10-CM | POA: Diagnosis not present

## 2023-08-05 DIAGNOSIS — H903 Sensorineural hearing loss, bilateral: Secondary | ICD-10-CM | POA: Diagnosis not present

## 2023-09-01 DIAGNOSIS — J3489 Other specified disorders of nose and nasal sinuses: Secondary | ICD-10-CM | POA: Diagnosis not present

## 2023-09-01 DIAGNOSIS — H90A21 Sensorineural hearing loss, unilateral, right ear, with restricted hearing on the contralateral side: Secondary | ICD-10-CM | POA: Diagnosis not present

## 2023-09-01 DIAGNOSIS — R040 Epistaxis: Secondary | ICD-10-CM | POA: Diagnosis not present

## 2023-09-06 NOTE — Progress Notes (Unsigned)
No chief complaint on file.  History of Present Illness: 82 yo female with history of mild CAD, hyperlipidemia, thyroid disease and kidney stones who is here today for cardiac follow up. I saw her as a new patient in 2013 but then she was lost to follow up until April 2021. She has been intolerant of statins in the past. In January 2013, she was stressed and felt her heart pounding. She felt that she was having a panic attack. There was no chest pain or SOB at that time. There was some shoulder pain but no chest pain. Echo in 2013 showed LVEF=55-60%. No valve disease. She has achalasia and had a Heller's myotomy. When I saw her in 2021 she c/o pain in her upper chest and into the neck. This happened after meals and mostly at rest. Echo 04/10/20 with LVEF=60-65%, grade 2 diastolic dysfunction. No aortic valve or mitral valve disease. Cardiac CTA in May 2021 with minimal plaque in the Circumflex and RCA.   She is here today for follow up. The patient denies any chest pain, dyspnea, palpitations, lower extremity edema, orthopnea, PND, dizziness, near syncope or syncope.   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   Hematuria    negative uro eval X 2   Hepatitis 12/08/1974   Hyperlipidemia    Nephrolithiasis    Osteopenia    dexa 2008   Thyroid disease     Past Surgical History:  Procedure Laterality Date   ABDOMINAL HYSTERECTOMY  1996   BSO secondary to AUB and fibroids   BOTOX INJECTION N/A 03/31/2014   Procedure: BOTOX INJECTION;  Surgeon: Petra Kuba, MD;  [facility]: [facility];  Service: Endoscopy;  Laterality: N/A;   CATARACT EXTRACTION W/PHACO Left 09/25/2021   Procedure: CATARACT EXTRACTION PHACO AND INTRAOCULAR LENS PLACEMENT ([facility]) LEFT 5.14  00:59.9;  Surgeon: Lockie Mola, MD;  Location: [facility];  Service: Ophthalmology;  Laterality: Left;  wants to be as early as possible   ESOPHAGEAL MANOMETRY N/A 03/27/2014   Procedure: ESOPHAGEAL MANOMETRY (EM);  Surgeon: Petra Kuba, MD;  [facility]: [facility];  Service: Endoscopy;  Laterality: N/A;   ESOPHAGOGASTRODUODENOSCOPY N/A 03/31/2014   Procedure: ESOPHAGOGASTRODUODENOSCOPY (EGD);  Surgeon: Petra Kuba, MD;  Location: [facility];  Service: Endoscopy;  Laterality: N/A;   ESOPHAGOGASTRODUODENOSCOPY ENDOSCOPY  summer 2013   Dr. Ewing Schlein   HELLER MYOTOMY  09/07/14   MOHS SURGERY  09/15/13   nose   THYROIDECTOMY, PARTIAL  1994   TONSILLECTOMY      Current Outpatient Medications  Medication Sig Dispense Refill   acetaminophen (TYLENOL) 160 MG/5ML solution Take 650 mg by mouth as needed.      Calcium Carbonate (CALCIUM 600 PO) Take by mouth.     gabapentin (NEURONTIN) 100 MG capsule TAKE 1 CAPSULE (100 MG) BY MOUTH AT BEDTIME (Patient taking differently: Take 100 mg by mouth as needed.) 90 capsule 3   Ibuprofen (ADVIL PO) Take 1 tablet by mouth as needed.     levothyroxine (SYNTHROID, LEVOTHROID) 112 MCG tablet Take 112 mcg by mouth daily.     Multiple Vitamins-Minerals (MULTIVITAMIN ADULTS PO) Take by mouth daily.     Multiple Vitamins-Minerals (PRESERVISION AREDS PO) Take 1  tablet by mouth 2 (two) times daily.     omeprazole (PRILOSEC) 20 MG capsule Take 20 mg by mouth daily.     temazepam (RESTORIL) 15 MG capsule as needed.     No current facility-administered medications for this visit.    Allergies  Allergen Reactions   Codeine     Hyperactive,overly awake   Penicillins    Sulfa Antibiotics     Social History   Socioeconomic History   Marital status: Married    Spouse name: Not on file   Number of children: 1   Years of education: Not on file   Highest education level: Not on file  Occupational History   Occupation: Retired Programmer, systems  Tobacco  Use   Smoking status: Never   Smokeless tobacco: Never  Vaping Use   Vaping status: Never Used  Substance and Sexual Activity   Alcohol use: Yes    Comment: occasional wine with dinner   Drug use: No   Sexual activity: Not Currently  Other Topics Concern   Not on file  Social History Narrative   Not on file   Social Determinants of Health   Financial Resource Strain: Not on file  Food Insecurity: Not on file  Transportation Needs: Not on file  Physical Activity: Not on file  Stress: Not on file  Social Connections: Not on file  Intimate Partner Violence: Not on file    Family History  Problem Relation Age of Onset   Hip fracture Mother    Dementia Mother    Coronary artery disease Father 31       CABG   Coronary artery disease Paternal Aunt     Review of Systems:  As stated in the HPI and otherwise negative.   There were no vitals taken for this visit.  Physical Examination: General: Well developed, well nourished, NAD  HEENT: OP clear, mucus membranes moist  SKIN: warm, dry. No rashes. Neuro: No focal deficits  Musculoskeletal: Muscle strength 5/5 all ext  Psychiatric: Mood and affect normal  Neck: No JVD, no carotid bruits, no thyromegaly, no lymphadenopathy.  Lungs:Clear bilaterally, no wheezes, rhonci, crackles Cardiovascular: Regular rate and rhythm. No murmurs, gallops or rubs. Abdomen:Soft. Bowel sounds present. Non-tender.  Extremities: No lower extremity edema. Pulses are 2 + in the bilateral DP/PT.  EKG:  EKG is *** ordered today. The ekg ordered today demonstrates   Echo 04/10/20:  1. Left ventricular ejection fraction, by estimation, is 60 to 65%. The  left ventricle has normal function. The left ventricle has no regional  wall motion abnormalities. Left ventricular diastolic parameters are  consistent with Grade II diastolic  dysfunction (pseudonormalization).   2. Right ventricular systolic function is normal. The right ventricular  size is  normal. There is mildly elevated pulmonary artery systolic  pressure.   3. The mitral valve is normal in structure. No evidence of mitral valve  regurgitation. No evidence of mitral stenosis.   4. Tricuspid valve regurgitation is moderate.   5. The aortic valve is normal in structure. Aortic valve regurgitation is  not visualized. No aortic stenosis is present.   6. The inferior vena cava is normal in size with greater than 50%  respiratory variability, suggesting right atrial pressure of 3 mmHg.   Recent Labs: No results found for requested labs within last 365 days.   Lipid Panel No results found for: "CHOL", "TRIG", "HDL", "CHOLHDL", "VLDL", "LDLCALC", "LDLDIRECT"   Wt Readings from Last 3 Encounters:  06/20/22 65.1 kg  09/25/21 66.3 kg  06/03/21 66.6 kg    Assessment and Plan:   1. CAD without angina: Coronary CTA in May 2021 with very mild CAD. Echo in 2021 with normal LV function and no aortic or mitral valve disease. No chest pain suggestive of angina. She does not wish to take an ASA due to GI issues and she does not wish to take a statin due to prior intolerance.   Labs/ tests ordered today include:   No orders of the defined types were placed in this encounter.  Disposition:   F/U with me in 12 months.   Signed, Verne Carrow, MD 09/06/2023 7:05 PM    [facility] [address] [place], [place], [place]  [address] Phone: [phone]; Fax: [phone]

## 2023-09-07 ENCOUNTER — Encounter: Payer: Self-pay | Admitting: Cardiovascular Disease

## 2023-09-07 ENCOUNTER — Ambulatory Visit: Payer: Medicare PPO | Attending: Cardiovascular Disease | Admitting: Cardiovascular Disease

## 2023-09-07 VITALS — Ht 65.0 in | Wt 144.0 lb

## 2023-09-07 DIAGNOSIS — K219 Gastro-esophageal reflux disease without esophagitis: Secondary | ICD-10-CM | POA: Diagnosis not present

## 2023-09-07 DIAGNOSIS — J301 Allergic rhinitis due to pollen: Secondary | ICD-10-CM | POA: Diagnosis not present

## 2023-09-07 DIAGNOSIS — I2510 Atherosclerotic heart disease of native coronary artery without angina pectoris: Secondary | ICD-10-CM | POA: Diagnosis not present

## 2023-09-07 DIAGNOSIS — H6982 Other specified disorders of Eustachian tube, left ear: Secondary | ICD-10-CM | POA: Diagnosis not present

## 2023-09-07 DIAGNOSIS — R0789 Other chest pain: Secondary | ICD-10-CM

## 2023-09-07 NOTE — Patient Instructions (Signed)

## 2023-09-14 ENCOUNTER — Ambulatory Visit: Payer: Medicare PPO | Admitting: Cardiovascular Disease

## 2023-09-16 DIAGNOSIS — D2272 Melanocytic nevi of left lower limb, including hip: Secondary | ICD-10-CM | POA: Diagnosis not present

## 2023-09-16 DIAGNOSIS — L538 Other specified erythematous conditions: Secondary | ICD-10-CM | POA: Diagnosis not present

## 2023-09-16 DIAGNOSIS — D1721 Benign lipomatous neoplasm of skin and subcutaneous tissue of right arm: Secondary | ICD-10-CM | POA: Diagnosis not present

## 2023-09-16 DIAGNOSIS — L820 Inflamed seborrheic keratosis: Secondary | ICD-10-CM | POA: Diagnosis not present

## 2023-09-16 DIAGNOSIS — R208 Other disturbances of skin sensation: Secondary | ICD-10-CM | POA: Diagnosis not present

## 2023-09-16 DIAGNOSIS — D2271 Melanocytic nevi of right lower limb, including hip: Secondary | ICD-10-CM | POA: Diagnosis not present

## 2023-09-16 DIAGNOSIS — D2262 Melanocytic nevi of left upper limb, including shoulder: Secondary | ICD-10-CM | POA: Diagnosis not present

## 2023-09-16 DIAGNOSIS — D2261 Melanocytic nevi of right upper limb, including shoulder: Secondary | ICD-10-CM | POA: Diagnosis not present

## 2023-09-16 DIAGNOSIS — Z85828 Personal history of other malignant neoplasm of skin: Secondary | ICD-10-CM | POA: Diagnosis not present

## 2023-09-29 DIAGNOSIS — E785 Hyperlipidemia, unspecified: Secondary | ICD-10-CM | POA: Diagnosis not present

## 2023-09-29 DIAGNOSIS — E039 Hypothyroidism, unspecified: Secondary | ICD-10-CM | POA: Diagnosis not present

## 2023-09-29 DIAGNOSIS — G4700 Insomnia, unspecified: Secondary | ICD-10-CM | POA: Diagnosis not present

## 2023-09-29 DIAGNOSIS — K219 Gastro-esophageal reflux disease without esophagitis: Secondary | ICD-10-CM | POA: Diagnosis not present

## 2023-09-29 DIAGNOSIS — R7301 Impaired fasting glucose: Secondary | ICD-10-CM | POA: Diagnosis not present

## 2023-09-29 DIAGNOSIS — G629 Polyneuropathy, unspecified: Secondary | ICD-10-CM | POA: Diagnosis not present

## 2023-09-29 DIAGNOSIS — E042 Nontoxic multinodular goiter: Secondary | ICD-10-CM | POA: Diagnosis not present

## 2023-09-29 DIAGNOSIS — I2584 Coronary atherosclerosis due to calcified coronary lesion: Secondary | ICD-10-CM | POA: Diagnosis not present

## 2023-11-10 DIAGNOSIS — Z961 Presence of intraocular lens: Secondary | ICD-10-CM | POA: Diagnosis not present

## 2023-11-10 DIAGNOSIS — H353132 Nonexudative age-related macular degeneration, bilateral, intermediate dry stage: Secondary | ICD-10-CM | POA: Diagnosis not present

## 2023-11-10 DIAGNOSIS — H2511 Age-related nuclear cataract, right eye: Secondary | ICD-10-CM | POA: Diagnosis not present

## 2023-11-16 DIAGNOSIS — H8102 Meniere's disease, left ear: Secondary | ICD-10-CM | POA: Diagnosis not present

## 2023-11-16 DIAGNOSIS — H903 Sensorineural hearing loss, bilateral: Secondary | ICD-10-CM | POA: Diagnosis not present

## 2023-12-28 DIAGNOSIS — K219 Gastro-esophageal reflux disease without esophagitis: Secondary | ICD-10-CM | POA: Diagnosis not present

## 2023-12-28 DIAGNOSIS — R200 Anesthesia of skin: Secondary | ICD-10-CM | POA: Diagnosis not present

## 2023-12-28 DIAGNOSIS — I2584 Coronary atherosclerosis due to calcified coronary lesion: Secondary | ICD-10-CM | POA: Diagnosis not present

## 2023-12-28 DIAGNOSIS — E042 Nontoxic multinodular goiter: Secondary | ICD-10-CM | POA: Diagnosis not present

## 2023-12-28 DIAGNOSIS — E785 Hyperlipidemia, unspecified: Secondary | ICD-10-CM | POA: Diagnosis not present

## 2023-12-28 DIAGNOSIS — R634 Abnormal weight loss: Secondary | ICD-10-CM | POA: Diagnosis not present

## 2023-12-28 DIAGNOSIS — G629 Polyneuropathy, unspecified: Secondary | ICD-10-CM | POA: Diagnosis not present

## 2023-12-28 DIAGNOSIS — R7301 Impaired fasting glucose: Secondary | ICD-10-CM | POA: Diagnosis not present

## 2023-12-28 DIAGNOSIS — E039 Hypothyroidism, unspecified: Secondary | ICD-10-CM | POA: Diagnosis not present

## 2024-01-19 DIAGNOSIS — H8102 Meniere's disease, left ear: Secondary | ICD-10-CM | POA: Diagnosis not present

## 2024-01-19 DIAGNOSIS — J301 Allergic rhinitis due to pollen: Secondary | ICD-10-CM | POA: Diagnosis not present

## 2024-03-18 DIAGNOSIS — E785 Hyperlipidemia, unspecified: Secondary | ICD-10-CM | POA: Diagnosis not present

## 2024-03-18 DIAGNOSIS — I2584 Coronary atherosclerosis due to calcified coronary lesion: Secondary | ICD-10-CM | POA: Diagnosis not present

## 2024-03-18 DIAGNOSIS — E7849 Other hyperlipidemia: Secondary | ICD-10-CM | POA: Diagnosis not present

## 2024-03-18 DIAGNOSIS — E042 Nontoxic multinodular goiter: Secondary | ICD-10-CM | POA: Diagnosis not present

## 2024-03-18 DIAGNOSIS — K219 Gastro-esophageal reflux disease without esophagitis: Secondary | ICD-10-CM | POA: Diagnosis not present

## 2024-03-18 DIAGNOSIS — E039 Hypothyroidism, unspecified: Secondary | ICD-10-CM | POA: Diagnosis not present

## 2024-03-18 DIAGNOSIS — R7301 Impaired fasting glucose: Secondary | ICD-10-CM | POA: Diagnosis not present

## 2024-03-18 DIAGNOSIS — M8580 Other specified disorders of bone density and structure, unspecified site: Secondary | ICD-10-CM | POA: Diagnosis not present

## 2024-03-30 ENCOUNTER — Ambulatory Visit (INDEPENDENT_AMBULATORY_CARE_PROVIDER_SITE_OTHER)

## 2024-03-30 ENCOUNTER — Ambulatory Visit: Admitting: Podiatry

## 2024-03-30 ENCOUNTER — Encounter: Payer: Self-pay | Admitting: Podiatry

## 2024-03-30 DIAGNOSIS — Z0000 Encounter for general adult medical examination without abnormal findings: Secondary | ICD-10-CM | POA: Diagnosis not present

## 2024-03-30 DIAGNOSIS — E039 Hypothyroidism, unspecified: Secondary | ICD-10-CM | POA: Diagnosis not present

## 2024-03-30 DIAGNOSIS — G4700 Insomnia, unspecified: Secondary | ICD-10-CM | POA: Diagnosis not present

## 2024-03-30 DIAGNOSIS — R634 Abnormal weight loss: Secondary | ICD-10-CM | POA: Diagnosis not present

## 2024-03-30 DIAGNOSIS — E785 Hyperlipidemia, unspecified: Secondary | ICD-10-CM | POA: Diagnosis not present

## 2024-03-30 DIAGNOSIS — M722 Plantar fascial fibromatosis: Secondary | ICD-10-CM

## 2024-03-30 DIAGNOSIS — R82998 Other abnormal findings in urine: Secondary | ICD-10-CM | POA: Diagnosis not present

## 2024-03-30 DIAGNOSIS — Z1331 Encounter for screening for depression: Secondary | ICD-10-CM | POA: Diagnosis not present

## 2024-03-30 DIAGNOSIS — M8580 Other specified disorders of bone density and structure, unspecified site: Secondary | ICD-10-CM | POA: Diagnosis not present

## 2024-03-30 DIAGNOSIS — Z1339 Encounter for screening examination for other mental health and behavioral disorders: Secondary | ICD-10-CM | POA: Diagnosis not present

## 2024-03-30 DIAGNOSIS — E042 Nontoxic multinodular goiter: Secondary | ICD-10-CM | POA: Diagnosis not present

## 2024-03-30 DIAGNOSIS — I2584 Coronary atherosclerosis due to calcified coronary lesion: Secondary | ICD-10-CM | POA: Diagnosis not present

## 2024-03-30 DIAGNOSIS — G629 Polyneuropathy, unspecified: Secondary | ICD-10-CM | POA: Diagnosis not present

## 2024-03-30 NOTE — Progress Notes (Signed)
 Subjective:  Patient ID: Stephanie Davila, female    DOB: [birth year],  MRN: [id number] HPI Chief Complaint  Patient presents with   Foot Pain    "I have heel pain.  It wakes me up at night at 1 am, like clockwork." N - heel pain L - left plantar, medial, and lateral heel D - over 1 week O - suddenly C - continuous pain at night, sharp pain A - get up in the morning T - none    83 y.o. female presents with the above complaint.   ROS: Denies fever chills nausea vomiting muscle aches pains calf pain back pain chest pain shortness of breath.  States that her left heel will start bothering her in the middle of the night but is bothersome when she is on the heel walking.    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   Hematuria    negative uro eval X 2   Hepatitis 12/08/1974   Hyperlipidemia    Nephrolithiasis    Osteopenia    dexa 2008   Thyroid  disease    Past Surgical History:  Procedure Laterality Date   ABDOMINAL HYSTERECTOMY  1996   BSO secondary to AUB and fibroids   BOTOX  INJECTION N/A 03/31/2014   Procedure: BOTOX  INJECTION;  Surgeon: Mathew Solomon, MD;  [facility]: [facility];  Service: Endoscopy;  Laterality: N/A;   CATARACT EXTRACTION W/PHACO Left 09/25/2021   Procedure: CATARACT EXTRACTION PHACO AND INTRAOCULAR LENS PLACEMENT ([facility]) LEFT 5.14 00:59.9;  Surgeon: Annell Kidney, MD;  Location: [facility];  Service: Ophthalmology;  Laterality: Left;  wants to be as early as possible   ESOPHAGEAL MANOMETRY N/A 03/27/2014   Procedure: ESOPHAGEAL MANOMETRY (EM);  Surgeon: Mathew Solomon, MD;  [facility];  Service: Endoscopy;  Laterality: N/A;   ESOPHAGOGASTRODUODENOSCOPY N/A 03/31/2014   Procedure: ESOPHAGOGASTRODUODENOSCOPY (EGD);  Surgeon: Mathew Solomon,  MD;  Location: [facility];  Service: Endoscopy;  Laterality: N/A;   ESOPHAGOGASTRODUODENOSCOPY ENDOSCOPY  summer 2013   Dr. Lavaughn [place]   HELLER MYOTOMY  09/07/14   MOHS SURGERY  09/15/13   nose   THYROIDECTOMY, PARTIAL  1994   TONSILLECTOMY      Current Outpatient Medications:    acetaminophen (TYLENOL) 160 MG/5ML solution, Take 650 mg by mouth as needed. , Disp: , Rfl:    Calcium Carbonate (CALCIUM 600 PO), Take by mouth., Disp: , Rfl:    hydrochlorothiazide (HYDRODIURIL) 12.5 MG tablet, Take 12.5 mg by mouth., Disp: , Rfl:    Ibuprofen (ADVIL PO), Take 1 tablet by mouth as needed., Disp: , Rfl:    levothyroxine (SYNTHROID, LEVOTHROID) 112 MCG tablet, Take 112 mcg by mouth daily., Disp: , Rfl:    Multiple Vitamins-Minerals (MULTIVITAMIN ADULTS PO), Take by mouth daily., Disp: , Rfl:    Multiple Vitamins-Minerals (PRESERVISION AREDS PO), Take 1 tablet by mouth 2 (two) times daily., Disp: , Rfl:    Multiple Vitamins-Minerals (PRESERVISION AREDS) CAPS, Take by mouth 2 (two) times daily., Disp: , Rfl:    omeprazole (PRILOSEC) 20 MG capsule, Take 20 mg by mouth daily., Disp: , Rfl:    temazepam (RESTORIL) 15 MG capsule, as needed., Disp: , Rfl:   Allergies  Allergen Reactions   Codeine  Hyperactive,overly awake   Penicillins    Sulfa Antibiotics    Review of Systems Objective:  There were no vitals filed for this visit.  General: Well developed, nourished, in no acute distress, alert and oriented x3   Dermatological: Skin is warm, dry and supple bilateral. Nails x 10 are well maintained; remaining integument appears unremarkable at this time. There are no open sores, no preulcerative lesions, no rash or signs of infection present.  Vascular: Dorsalis Pedis artery and Posterior Tibial artery pedal pulses are 2/4 bilateral with immedate capillary fill time. Pedal hair growth present. No varicosities and no lower extremity edema present bilateral.   Neruologic: Grossly intact via light  touch bilateral. Vibratory intact via tuning fork bilateral. Protective threshold with Semmes Wienstein monofilament intact to all pedal sites bilateral. Patellar and Achilles deep tendon reflexes 2+ bilateral. No Babinski or clonus noted bilateral.   Musculoskeletal: No gross boney pedal deformities bilateral. No pain, crepitus, or limitation noted with foot and ankle range of motion bilateral. Muscular strength 5/5 in all groups tested bilateral.  Some tenderness on palpation of the plantar medial calcaneal tubercle left.  Some tenderness on medial lateral compression of the calcaneus.  No pain on palpation of the posterior tibial tendon peroneal tendons or the Achilles tendon.  Gait: Unassisted, Nonantalgic.    Radiographs:  Radiographs taken today demonstrate osseously mature individual with a rectus foot type.  Soft tissue increase in density at the plantar fascial calcaneal insertion site no acute findings identified.  Mild to moderate demineralized bone.  Assessment & Plan:   Assessment: Most likely plantar fasciitis left.  Plan: Discussed etiology pathology conservative surgical therapies at this point I injected the left heel today with 20 mg Kenalog  5 mg Marcaine point maximal tenderness.  She tolerated procedure well without complications and we discussed appropriate shoe gear.  I will follow-up with her in about 1 month if necessary.     Deovion Batrez T. [place], [place]

## 2024-03-31 DIAGNOSIS — M722 Plantar fascial fibromatosis: Secondary | ICD-10-CM | POA: Diagnosis not present

## 2024-03-31 MED ORDER — TRIAMCINOLONE ACETONIDE 40 MG/ML IJ SUSP
20.0000 mg | Freq: Once | INTRAMUSCULAR | Status: AC
  Administered 2024-03-31: 20 mg

## 2024-03-31 NOTE — Addendum Note (Signed)
 Addended by: Sanda Crome on: 03/31/2024 08:54 AM   Modules accepted: Orders, Level of Service

## 2024-04-12 DIAGNOSIS — H903 Sensorineural hearing loss, bilateral: Secondary | ICD-10-CM | POA: Diagnosis not present

## 2024-04-12 DIAGNOSIS — H6060 Unspecified chronic otitis externa, unspecified ear: Secondary | ICD-10-CM | POA: Diagnosis not present

## 2024-04-12 DIAGNOSIS — H8102 Meniere's disease, left ear: Secondary | ICD-10-CM | POA: Diagnosis not present

## 2024-04-12 DIAGNOSIS — H6121 Impacted cerumen, right ear: Secondary | ICD-10-CM | POA: Diagnosis not present

## 2024-05-10 DIAGNOSIS — H353132 Nonexudative age-related macular degeneration, bilateral, intermediate dry stage: Secondary | ICD-10-CM | POA: Diagnosis not present

## 2024-05-10 DIAGNOSIS — H2511 Age-related nuclear cataract, right eye: Secondary | ICD-10-CM | POA: Diagnosis not present

## 2024-05-10 DIAGNOSIS — H04123 Dry eye syndrome of bilateral lacrimal glands: Secondary | ICD-10-CM | POA: Diagnosis not present

## 2024-05-10 DIAGNOSIS — H43813 Vitreous degeneration, bilateral: Secondary | ICD-10-CM | POA: Diagnosis not present

## 2024-05-27 DIAGNOSIS — Z1231 Encounter for screening mammogram for malignant neoplasm of breast: Secondary | ICD-10-CM | POA: Diagnosis not present

## 2024-08-05 DIAGNOSIS — H43813 Vitreous degeneration, bilateral: Secondary | ICD-10-CM | POA: Diagnosis not present

## 2024-08-05 DIAGNOSIS — H2511 Age-related nuclear cataract, right eye: Secondary | ICD-10-CM | POA: Diagnosis not present

## 2024-08-05 DIAGNOSIS — H04123 Dry eye syndrome of bilateral lacrimal glands: Secondary | ICD-10-CM | POA: Diagnosis not present

## 2024-08-05 DIAGNOSIS — H353132 Nonexudative age-related macular degeneration, bilateral, intermediate dry stage: Secondary | ICD-10-CM | POA: Diagnosis not present

## 2024-08-25 ENCOUNTER — Ambulatory Visit: Admitting: Podiatry

## 2024-08-29 ENCOUNTER — Ambulatory Visit: Attending: Cardiovascular Disease | Admitting: Cardiovascular Disease

## 2024-08-29 VITALS — BP 140/70 | HR 62 | Ht 65.0 in | Wt 135.0 lb

## 2024-08-29 DIAGNOSIS — I2510 Atherosclerotic heart disease of native coronary artery without angina pectoris: Secondary | ICD-10-CM | POA: Diagnosis not present

## 2024-08-29 DIAGNOSIS — I071 Rheumatic tricuspid insufficiency: Secondary | ICD-10-CM | POA: Diagnosis not present

## 2024-08-29 NOTE — Progress Notes (Signed)
 Chief Complaint  Patient presents with   Follow-up    CAD   History of Present Illness: 83 yo female with history of mild CAD, hyperlipidemia, thyroid  disease and kidney stones who is here today for cardiac follow up. I saw her as a new patient in 2013 but then she was lost to follow up until April 2021. She has been intolerant of statins in the past. In January 2013, she was stressed and felt her heart pounding. She felt that she was having a panic attack. There was no chest pain or SOB at that time. Echo in 2013 showed LVEF=55-60%. No valve disease. She has achalasia and had a Heller's myotomy. When I saw her in 2021 she c/o pain in her upper chest and into the neck. This happened after meals and mostly at rest. Echo 04/10/20 with LVEF=60-65%, grade 2 diastolic dysfunction. No aortic valve or mitral valve disease. Cardiac CTA in May 2021 with minimal plaque in the Circumflex and RCA.   She is here today for follow up. The patient denies any chest pain, dyspnea, palpitations, lower extremity edema, orthopnea, PND, dizziness, near syncope or syncope.   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   Hematuria    negative uro eval X 2   Hepatitis 12/08/1974   Hyperlipidemia    Nephrolithiasis    Osteopenia    dexa 2008   Thyroid  disease     Past Surgical History:  Procedure Laterality Date   ABDOMINAL HYSTERECTOMY  1996   BSO secondary to AUB and fibroids   BOTOX  INJECTION N/A 03/31/2014   Procedure: BOTOX  INJECTION;  Surgeon: Oliva FORBES Boots, MD;  Location: [facility];  Service: Endoscopy;  Laterality: N/A;   CATARACT EXTRACTION W/PHACO Left 09/25/2021   Procedure: CATARACT EXTRACTION PHACO AND INTRAOCULAR LENS PLACEMENT (IOC) LEFT 5.14 00:59.9;   Surgeon: Mittie Gaskin, MD;  Location: [facility];  Service: Ophthalmology;  Laterality: Left;  wants to be as early as possible   ESOPHAGEAL MANOMETRY N/A 03/27/2014   Procedure: ESOPHAGEAL MANOMETRY (EM);  Surgeon: Oliva FORBES Boots, MD;  Location: [facility];  Service: Endoscopy;  Laterality: N/A;   ESOPHAGOGASTRODUODENOSCOPY N/A 03/31/2014   Procedure: ESOPHAGOGASTRODUODENOSCOPY (EGD);  Surgeon: Oliva FORBES Boots, MD;  Location: [facility];  Service: Endoscopy;  Laterality: N/A;   ESOPHAGOGASTRODUODENOSCOPY ENDOSCOPY  summer 2013   Dr. Boots   HELLER MYOTOMY  09/07/14   MOHS SURGERY  09/15/13   nose   THYROIDECTOMY, PARTIAL  1994   TONSILLECTOMY      Current Outpatient Medications  Medication Sig Dispense Refill   acetaminophen (TYLENOL) 160 MG/5ML solution Take 650 mg by mouth as needed.      Calcium Carbonate (CALCIUM 600 PO) Take by mouth.     hydrochlorothiazide (HYDRODIURIL) 12.5 MG tablet Take 12.5 mg by mouth.     Ibuprofen (ADVIL PO) Take 1 tablet by mouth as needed.     levothyroxine (SYNTHROID, LEVOTHROID) 112 MCG tablet Take 112 mcg by mouth daily.     Multiple Vitamins-Minerals (MULTIVITAMIN ADULTS PO) Take by mouth daily.     Multiple Vitamins-Minerals (PRESERVISION AREDS PO) Take 1 tablet by mouth 2 (two) times daily.     omeprazole (PRILOSEC) 20 MG  capsule Take 20 mg by mouth daily.     temazepam (RESTORIL) 15 MG capsule as needed.     No current facility-administered medications for this visit.    Allergies  Allergen Reactions   Codeine     Hyperactive,overly awake   Penicillins    Sulfa Antibiotics     Social History   Socioeconomic History   Marital status: Married    Spouse name: Not on file   Number of children: 1   Years of education: Not on file   Highest education level: Not on file  Occupational History   Occupation: Retired Programmer, systems  Tobacco Use   Smoking status: Never   Smokeless tobacco: Never  Vaping Use   Vaping status: Never  Used  Substance and Sexual Activity   Alcohol  use: Not Currently    Comment: occasional wine with dinner   Drug use: No   Sexual activity: Not Currently  Other Topics Concern   Not on file  Social History Narrative   Not on file   Social Drivers of Health   Financial Resource Strain: Not on file  Food Insecurity: Not on file  Transportation Needs: Not on file  Physical Activity: Not on file  Stress: Not on file  Social Connections: Not on file  Intimate Partner Violence: Not on file    Family History  Problem Relation Age of Onset   Hip fracture Mother    Dementia Mother    Coronary artery disease Father 51       CABG   Coronary artery disease Paternal Aunt     Review of Systems:  As stated in the HPI and otherwise negative.   BP (!) 140/70 (BP Location: Left Arm, Patient Position: Sitting, Cuff Size: Normal)   Pulse 62   Ht 5' 5 (1.651 m)   Wt 135 lb (61.2 kg)   BMI 22.47 kg/m   Physical Examination: General: Well developed, well nourished, NAD  HEENT: OP clear, mucus membranes moist  SKIN: warm, dry. No rashes. Neuro: No focal deficits  Musculoskeletal: Muscle strength 5/5 all ext  Psychiatric: Mood and affect normal  Neck: No JVD, no carotid bruits, no thyromegaly, no lymphadenopathy.  Lungs:Clear bilaterally, no wheezes, rhonci, crackles Cardiovascular: Regular rate and rhythm. No murmurs, gallops or rubs. Abdomen:Soft. Bowel sounds present. Non-tender.  Extremities: No lower extremity edema. Pulses are 2 + in the bilateral DP/PT.  EKG:  EKG is ordered today. The ekg ordered today demonstrates  EKG Interpretation Date/Time:  Monday August 29 2024 10:45:11 EDT Ventricular Rate:  62 PR Interval:  146 QRS Duration:  66 QT Interval:  430 QTC Calculation: 436 R Axis:   13  Text Interpretation: Normal sinus rhythm Normal ECG Confirmed by Verlin Bruckner [phone]) on 08/29/2024 10:55:39 AM    Recent Labs: No results found for requested labs  within last 365 days.   Lipid Panel No results found for: CHOL, TRIG, HDL, CHOLHDL, VLDL, LDLCALC, LDLDIRECT   Wt Readings from Last 3 Encounters:  08/29/24 135 lb (61.2 kg)  09/07/23 144 lb (65.3 kg)  06/20/22 143 lb 9.6 oz (65.1 kg)    Assessment and Plan:   1. CAD without angina: Coronary CTA in May 2021 with very mild CAD. Echo in 2021 with normal LV function and no aortic or mitral valve disease. No chest pain. She does not wish to take an ASA due to GI issues and she does not wish to take a statin due to prior intolerance.   2.  Tricuspid regurgitation: Moderate by echo in 2021. No murmur on exam.   Labs/ tests ordered today include:   Orders Placed This Encounter  Procedures   EKG 12-Lead   Disposition:   F/U with me in 12 months.   Signed, Lonni Cash, MD 08/29/2024 12:25 PM    [facility] [address] [place], [place], [place]  [address] Phone: [phone]; Fax: [phone]

## 2024-08-29 NOTE — Patient Instructions (Signed)

## 2024-09-07 ENCOUNTER — Ambulatory Visit: Admitting: Podiatry

## 2024-09-08 ENCOUNTER — Encounter: Payer: Self-pay | Admitting: Podiatry

## 2024-09-08 ENCOUNTER — Ambulatory Visit: Admitting: Podiatry

## 2024-09-08 DIAGNOSIS — M722 Plantar fascial fibromatosis: Secondary | ICD-10-CM | POA: Diagnosis not present

## 2024-09-09 NOTE — Progress Notes (Signed)
 She presents today for a chief concern of her left heel pain.  States that the foot is better at this time but was hurting up until 2 days ago but has improved some ever since last visit.  Objective: Vital signs are stable alert oriented x 3.  Pulses are palpable.  She has pain on palpation medial calcaneal tubercle of the left heel.  No pain on medial-lateral compression of the calcaneus.  Assessment: Plan fasciitis resolving.  Plan: Since she is improved at this time I see no need for further treatment.  She will notify us  with any reoccurrence.

## 2024-09-20 DIAGNOSIS — E039 Hypothyroidism, unspecified: Secondary | ICD-10-CM | POA: Diagnosis not present

## 2024-09-20 DIAGNOSIS — Z23 Encounter for immunization: Secondary | ICD-10-CM | POA: Diagnosis not present

## 2024-09-20 DIAGNOSIS — G629 Polyneuropathy, unspecified: Secondary | ICD-10-CM | POA: Diagnosis not present

## 2024-09-20 DIAGNOSIS — I2584 Coronary atherosclerosis due to calcified coronary lesion: Secondary | ICD-10-CM | POA: Diagnosis not present

## 2024-09-20 DIAGNOSIS — R7301 Impaired fasting glucose: Secondary | ICD-10-CM | POA: Diagnosis not present

## 2024-09-20 DIAGNOSIS — K219 Gastro-esophageal reflux disease without esophagitis: Secondary | ICD-10-CM | POA: Diagnosis not present

## 2024-09-20 DIAGNOSIS — E785 Hyperlipidemia, unspecified: Secondary | ICD-10-CM | POA: Diagnosis not present

## 2024-09-20 DIAGNOSIS — D126 Benign neoplasm of colon, unspecified: Secondary | ICD-10-CM | POA: Diagnosis not present

## 2024-09-20 DIAGNOSIS — N200 Calculus of kidney: Secondary | ICD-10-CM | POA: Diagnosis not present

## 2024-09-20 DIAGNOSIS — M8589 Other specified disorders of bone density and structure, multiple sites: Secondary | ICD-10-CM | POA: Diagnosis not present

## 2024-09-20 DIAGNOSIS — E042 Nontoxic multinodular goiter: Secondary | ICD-10-CM | POA: Diagnosis not present

## 2024-09-21 DIAGNOSIS — Z08 Encounter for follow-up examination after completed treatment for malignant neoplasm: Secondary | ICD-10-CM | POA: Diagnosis not present

## 2024-09-21 DIAGNOSIS — L821 Other seborrheic keratosis: Secondary | ICD-10-CM | POA: Diagnosis not present

## 2024-09-21 DIAGNOSIS — D225 Melanocytic nevi of trunk: Secondary | ICD-10-CM | POA: Diagnosis not present

## 2024-09-21 DIAGNOSIS — D2272 Melanocytic nevi of left lower limb, including hip: Secondary | ICD-10-CM | POA: Diagnosis not present

## 2024-09-21 DIAGNOSIS — D692 Other nonthrombocytopenic purpura: Secondary | ICD-10-CM | POA: Diagnosis not present

## 2024-09-21 DIAGNOSIS — D2271 Melanocytic nevi of right lower limb, including hip: Secondary | ICD-10-CM | POA: Diagnosis not present

## 2024-09-21 DIAGNOSIS — D1721 Benign lipomatous neoplasm of skin and subcutaneous tissue of right arm: Secondary | ICD-10-CM | POA: Diagnosis not present

## 2024-09-21 DIAGNOSIS — D2262 Melanocytic nevi of left upper limb, including shoulder: Secondary | ICD-10-CM | POA: Diagnosis not present

## 2024-09-21 DIAGNOSIS — D2261 Melanocytic nevi of right upper limb, including shoulder: Secondary | ICD-10-CM | POA: Diagnosis not present

## 2024-10-11 DIAGNOSIS — H90A22 Sensorineural hearing loss, unilateral, left ear, with restricted hearing on the contralateral side: Secondary | ICD-10-CM | POA: Diagnosis not present

## 2024-10-11 DIAGNOSIS — H6123 Impacted cerumen, bilateral: Secondary | ICD-10-CM | POA: Diagnosis not present

## 2024-10-11 DIAGNOSIS — H8102 Meniere's disease, left ear: Secondary | ICD-10-CM | POA: Diagnosis not present

## 2024-10-17 ENCOUNTER — Encounter: Payer: Self-pay | Admitting: Otolaryngology

## 2024-10-17 ENCOUNTER — Other Ambulatory Visit: Payer: Self-pay | Admitting: Otolaryngology

## 2024-10-17 DIAGNOSIS — H9312 Tinnitus, left ear: Secondary | ICD-10-CM

## 2024-10-28 ENCOUNTER — Ambulatory Visit
Admission: RE | Admit: 2024-10-28 | Discharge: 2024-10-28 | Disposition: A | Discharge status: Home / Self Care | Source: Ambulatory Visit | Attending: Otolaryngology | Admitting: Otolaryngology

## 2024-10-28 DIAGNOSIS — H9319 Tinnitus, unspecified ear: Secondary | ICD-10-CM | POA: Diagnosis not present

## 2024-10-28 DIAGNOSIS — H9312 Tinnitus, left ear: Secondary | ICD-10-CM

## 2024-10-28 MED ORDER — GADOPICLENOL 0.5 MMOL/ML IV SOLN
6.0000 mL | Freq: Once | INTRAVENOUS | Status: AC | PRN
Start: 2024-10-28 — End: 2024-10-28
  Administered 2024-10-28: 6 mL via INTRAVENOUS

## 2024-11-01 ENCOUNTER — Other Ambulatory Visit

## 2024-11-14 DIAGNOSIS — H8102 Meniere's disease, left ear: Secondary | ICD-10-CM | POA: Diagnosis not present
